# Patient Record
Sex: Male | Born: 1945 | Race: White | Hispanic: No | Marital: Married | Smoking: Never smoker
Health system: Southern US, Community
[De-identification: ages and names within clinical notes are randomized; demographics above are authoritative.]

## PROBLEM LIST (undated history)

## (undated) DIAGNOSIS — I5189 Other ill-defined heart diseases: Secondary | ICD-10-CM

## (undated) DIAGNOSIS — K589 Irritable bowel syndrome without diarrhea: Secondary | ICD-10-CM

## (undated) DIAGNOSIS — I214 Non-ST elevation (NSTEMI) myocardial infarction: Secondary | ICD-10-CM

## (undated) DIAGNOSIS — I2510 Atherosclerotic heart disease of native coronary artery without angina pectoris: Secondary | ICD-10-CM

## (undated) DIAGNOSIS — E785 Hyperlipidemia, unspecified: Secondary | ICD-10-CM

## (undated) DIAGNOSIS — I10 Essential (primary) hypertension: Secondary | ICD-10-CM

## (undated) DIAGNOSIS — N200 Calculus of kidney: Secondary | ICD-10-CM

## (undated) DIAGNOSIS — K469 Unspecified abdominal hernia without obstruction or gangrene: Secondary | ICD-10-CM

## (undated) DIAGNOSIS — K219 Gastro-esophageal reflux disease without esophagitis: Secondary | ICD-10-CM

## (undated) DIAGNOSIS — E119 Type 2 diabetes mellitus without complications: Secondary | ICD-10-CM

## (undated) DIAGNOSIS — K5790 Diverticulosis of intestine, part unspecified, without perforation or abscess without bleeding: Secondary | ICD-10-CM

## (undated) HISTORY — DX: Type 2 diabetes mellitus without complications: E11.9

## (undated) HISTORY — PX: LITHOTRIPSY: SUR834

## (undated) HISTORY — DX: Calculus of kidney: N20.0

## (undated) HISTORY — DX: Hyperlipidemia, unspecified: E78.5

## (undated) HISTORY — DX: Irritable bowel syndrome, unspecified: K58.9

## (undated) HISTORY — DX: Diverticulosis of intestine, part unspecified, without perforation or abscess without bleeding: K57.90

## (undated) HISTORY — PX: CORONARY STENT PLACEMENT: SHX1402

## (undated) HISTORY — DX: Non-ST elevation (NSTEMI) myocardial infarction: I21.4

---

## 2001-10-22 DIAGNOSIS — I2510 Atherosclerotic heart disease of native coronary artery without angina pectoris: Secondary | ICD-10-CM

## 2001-10-22 HISTORY — DX: Atherosclerotic heart disease of native coronary artery without angina pectoris: I25.10

## 2002-09-08 ENCOUNTER — Inpatient Hospital Stay (HOSPITAL_COMMUNITY): Admission: EM | Admit: 2002-09-08 | Discharge: 2002-09-11 | Payer: Self-pay | Admitting: Emergency Medicine

## 2006-04-24 ENCOUNTER — Inpatient Hospital Stay (HOSPITAL_COMMUNITY): Admission: EM | Admit: 2006-04-24 | Discharge: 2006-04-26 | Payer: Self-pay | Admitting: Cardiology

## 2006-04-24 ENCOUNTER — Ambulatory Visit: Payer: Self-pay | Admitting: Cardiology

## 2006-05-20 ENCOUNTER — Ambulatory Visit: Payer: Self-pay | Admitting: Cardiology

## 2007-02-14 ENCOUNTER — Ambulatory Visit (HOSPITAL_COMMUNITY): Admission: RE | Admit: 2007-02-14 | Discharge: 2007-02-14 | Payer: Self-pay | Admitting: General Surgery

## 2007-12-05 ENCOUNTER — Ambulatory Visit (HOSPITAL_COMMUNITY): Admission: RE | Admit: 2007-12-05 | Discharge: 2007-12-05 | Payer: Self-pay | Admitting: Family Medicine

## 2009-10-22 HISTORY — PX: COLONOSCOPY: SHX174

## 2011-03-09 NOTE — Cardiovascular Report (Signed)
[name], [name] NO.:  [id number]   MEDICAL RECORD NO.:  [id number]          PATIENT TYPE:  INP   LOCATION:  4736                         FACILITY:  [facility]   PHYSICIAN:  Arturo Morton. Riley Kill, M.D. [facility] OF BIRTH:  1946/02/28   DATE OF PROCEDURE:  04/25/2006  DATE OF DISCHARGE:                              CARDIAC CATHETERIZATION   INDICATIONS:  The patient is a 65 year old gentleman with multiple medical  problems.  He has diabetes mellitus.  He underwent stenting of both the  distal circumflex and posterior descending artery by me in 2003.  He has had  some recurrent chest pain, was evaluated by Dr. Andee Lineman, and sent in for  cardiac catheterization.  There were no definite electrocardiographic  abnormalities.  Initial set of enzymes at [facility] was negative.   PROCEDURES:  1.  Left heart catheterization.  2.  Selective coronary arteriography.  3.  Selective left ventriculography.   DESCRIPTION OF PROCEDURE:  The patient was brought to the catheterization  laboratory and prepped and draped in the usual fashion.  Through an anterior  puncture, the right femoral artery was easily entered.  A 6-French sheath  was placed.  Views of the left and right coronary arteries were obtained in  multiple angiographic projections.  Central aortic and left ventricular  pressures were measured with a pigtail.  Ventriculography was performed in  the RAO projection.  Because of elevated blood pressures, 2 doses of  intravenous metoprolol were administered.  He tolerated the procedure  without complication.  We reviewed the films with the patient in the  laboratory, and I subsequently spoke with his girlfriend afterwards.  He was  taken to the holding area in satisfactory clinical condition for direct  sheath removal.   HEMODYNAMIC DATA.:  1.  Central aortic pressure 156/85, mean 115, after one dose of intravenous      metoprolol.  2.  Left atrial pressure 155/12.  3.  No  gradient on pullback across the aortic valve.   ANGIOGRAPHIC DATA.:  1.  Ventriculography was done in the RAO projection.  Overall estimated      ejection fraction was in excess of 55% without segmental wall motion      abnormalities, and there did not appear to be significant mitral      regurgitation.  2.  On plain fluoroscopy there is evidence of fairly heavy calcification in      the left anterior descending artery.  3.  The right coronary artery is a previously-stented vessel in the PDA      distribution.  The proximal, mid and distal right are widely patent.      They are fairly smooth.  The posterior descending artery is also patent,      and the previous stent site is about 30 to no more than 40% narrowing      with a minimum lumen diameter well in excess of 2.  There is a small      posterolateral system that is free of critical disease.  4.  The left main is free of critical disease.  5.  The circumflex coronary artery provides essentially three marginal      branches.  The circumflex is previously stented after the origin of the      first two marginal branches.  There is some mild proximal calcification      but no significant focal narrowing.  At the previous site of stenting in      the distal vessel, there is absolutely no evidence of significant      restenosis and the vessel fills the distal vessel quite fully.  6.  The left anterior descending artery courses to the apex.  There are 3      diagonal branches.  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      This was previously described as 50-60% and does not appear to be      substantially changed, and we would grade this as 50% and possibly      relate it to intramyocardial compression.   CONCLUSIONS:  1.  Preserved overall left ventricular function.  2.   Widely patent stent to the distal circumflex.  3.  Patent stent to the posterior descending artery.  4.  Segmental narrowing of the distal left anterior descending artery      unchanged from the previous study.   I have spoken with Dr. Andee Lineman.  Continued medical therapy will be  recommended.  We will get a D-dimer.      Arturo Morton. Riley Kill, M.D. [facility]  Electronically Signed     TDS/MEDQ  D:  04/25/2006  T:  04/25/2006  Job:  [id number]   cc:   Learta Codding, M.D. [facility]  [address]  [address]  [place]  [place] [address]   Patrica Duel, M.D.  Fax: [phone]   Cardiovascular Laboratory

## 2011-03-09 NOTE — Cardiovascular Report (Signed)
NAME:  Dustin [name], Dustin [name]                       ACCOUNT NO.:  [id number]   MEDICAL RECORD NO.:  [id number]                   PATIENT TYPE:  INP   LOCATION:  6531                                 FACILITY:  [facility]   PHYSICIAN:  Arturo Morton. Riley Kill, M.D. [facility]         DATE OF BIRTH:  Feb 11, 1946   DATE OF PROCEDURE:  09/09/2002  DATE OF DISCHARGE:                              CARDIAC CATHETERIZATION   INDICATIONS FOR PROCEDURE:  The patient is a 65 year old gentleman who  presented with a non-ST elevation myocardial infarction.  EKG is  nondiagnostic.  He underwent cardiac catheterization by Dr. Lewayne Bunting and  this demonstrated segmental plaquing of the mid LAD, a very high-grade  stenosis in the distal circumflex, and a high-grade stenosis in the mid  posterior descending branch.  We discussed the case with the patient and  subsequently recommended percutaneous intervention.  The patient had been  previously enrolled in the ACUITY trial and was on bivalirudin.   PROCEDURE:  1. Percutaneous stenting of the distal circumflex artery.  2. Percutaneous stenting of the posterior descending artery/   DESCRIPTION OF PROCEDURE:  Diagnostic catheterization had been performed  with a #6 French sheath.  Additional bivalirudin was given and the #6 [place]  sheath exchanged for a #7 [place] sheath.  The #7 French sheath was then used  as the access conduit.  We initially chose a JL 3.5 guiding catheter to  engage the left main artery.  The lesion was crossed with a 0.014 Hi-Torque  Floppy wire and then predilated with a 2-mm balloon.  Stenting was  subsequently performed with a 2.75 x 18-mm Guidant Zeta stent.  This was  deployed without complication and then the stent was post dilated using a  noncompliant 3.25 Quantum Maverick balloon.  There was marked improvement in  the appearance of the artery.  From TIMI-1 flow, TIMI-3 was established and  the stenosis was reduced from 99% down to 0%.  We elected  not to place a  drug-eluting stent, in part, because of the very small area of distal  runoff, with this consisting of predominantly two small posterolateral  branches.   Attention was then turned to the PDA.  A JL 4 guiding catheter was utilized  with sideholes.  A 0.014 Hi-Torque Floppy wire was taken down across the  lesion and the lesion was predilated with a 2.0- and then subsequently, a  2.5-mm, balloon.  There was a small area of intraluminal dissection at the  site of the lesion.  There was also a fair amount of distal spasm which made  sizing of the stent somewhat more difficult.  We elected to place a 2.5 x 23-  mm Pixel stent and this resulted in establishment of a good lumen.  We then  post dilated the stent with a 2.75-mm Quantum Maverick balloon up to about  15 atmospheres, taking extra care to maintain patency within the stent.  At  the completion of the procedure, there was a uniform-appearing lumen.  The  stenosis was reduced from 99% down to 0%.   Plavix, 300 mg, was given at the beginning of the procedure.  At the end of  the procedure, the femoral sheath was sewn into place and the patient was  taken to the holding area in satisfactory clinical condition.   ANGIOGRAPHIC DATA:  1. The distal circumflex demonstrated a 99% stenosis with TIMI-1 flow and     this was subsequently turned into TIMI-3 flow with 0% residual stenosis.  2. The posterior descending artery had 99% narrowing with TIMI-3 flow and 0%     narrowing with TIMI-3 flow at the completion of the procedure.  The     distal reference lumen diameter was about 2.75 mm.    CONCLUSION:  Successful percutaneous stenting of the distal circumflex as  well as the posterior descending branch of the right coronary artery.   DISPOSITION:  The patient will be treated with Plavix.  He will need  aggressive medical therapy.                                                Arturo Morton. Riley Kill, M.D. [facility]    TDS/MEDQ   D:  09/09/2002  T:  09/09/2002  Job:  [id number]   cc:   Cardiac Catheterization Lab   Patrica Duel, M.D.  [address]  [place]  [place] [address]  Fax: [phone]   Rollene Rotunda, M.D. [facility]  [address]  [place]  [place] [address]  Fax: [phone]

## 2011-03-09 NOTE — Discharge Summary (Signed)
NAMEOSKAR, CRETELLA NO.:  [id number]   MEDICAL RECORD NO.:  [id number]          PATIENT TYPE:  INP   LOCATION:  4736                         FACILITY:  [facility]   PHYSICIAN:  Salvadore Farber, M.D. [facility] OF BIRTH:  1946-05-08   DATE OF ADMISSION:  04/24/2006  DATE OF DISCHARGE:  04/26/2006                                 DISCHARGE SUMMARY   REASON FOR ADMISSION:  Chest pain, concerning for unstable angina pectoris.   DISCHARGE DIAGNOSES:  1.  Noncardiac chest pain.      1.  Patent circumflex and posterior descending artery stent by          catheterization this admission.  2.  Coronary artery disease.      1.  Status post non-ST elevation myocardial infarction, November 2003,          treated with bare-metal stenting to the distal circumflex and          posterior descending artery.  3.  Preserved left ventricular function.  4.  Acute gouty arthritis, left ankle.  5.  Hypertension.  6.  Mixed dyslipidemia.  7.  Diabetes mellitus type 2.  8.  History of irritable bowel syndrome.  9.  History of obstructive sleep apnea.   PROCEDURE PERFORMED:  As mentioned, cardiac catheterization by Dr. Shawnie Pons on July 5th, 2007.  Please see his dictated note for complete  details.  The films revealed preserved overall left ventricular function,  widely patent stent of the distal circumflex, patent stent to the posterior  descending artery and segmental narrowing of the distal LAD, unchanged from  previous study.   BRIEF HISTORY:  Mr. Rorie is a 65 year old male with multiple medical  problems who presented to [facility] with chest pain that was  concerning for unstable angina pectoris.  He was transferred for further  evaluation and treatment.  He was admitted by Dr. Andee Lineman.   HOSPITAL COURSE:  As noted above, the patient was admitted for further  evaluation of his chest discomfort.  It was noted on admission that he had  an acute gouty flair of his  left ankle.  He had recently been seen urgent  care and placed on Relafen for this.  He had also had a recent right great  toe ingrown toenail removed and is on Zithromax.  He was ruled out for  myocardial infarction by enzymes.  He went for cardiac catheterization on  April 25, 2006 with Dr. Riley Kill.  The results are noted above.  Continue with  medical therapy as recommended.  He tolerated the procedure well and did not  have any complications.  The D-dimer was checked and this was negative 0.22.  On the morning of April 26, 2006, the patient was in stable condition without  any pleuritic chest pain or shortness of breath.  He will be placed on a  trial of proton pump inhibitor and will need to follow up with his primary  care physician.  His right groin is without hematomas or bruits.  He will  remain on his Relafen for his acute gouty  attack as this seems to be  improving.  He will stay off his allopurinol and follow up with his primary  care physician in the next 1-2 weeks.  He can follow up with Dr. Andee Lineman in  [place] in the next couple of weeks in cardiology followup.   LABS AND ANCILLARY DATA:  White count 8000, hemoglobin 13.7, hematocrit  40.2, platelet count 109,000.  INR 0.9, D-dimer 0.22, sodium 139, potassium  4.2, glucose 134, BUN 21, creatinine 1.2, calcium 9.2.  LFTs okay.  Total  protein 7.2, albumin 4.3, cardiac markers negative x3.  Total cholesterol  147, triglycerides 311, HDL 30, LDL 55.   Chest x-ray no acute abnormalities.   DISCHARGE MEDICATIONS:  1.  Fish oil as taken previously.  2.  Aspirin 325 mg daily.  3.  Lipitor 40 mg nightly.  4.  Niaspan as taken previously.  5.  Avandia 4 mg daily.  6.  Allopurinol 300 mg daily.  This is to be held until his gout attack      clears.  7.  Lisinopril/HCT 20/12.5 mg daily.  8.  Nitroglycerin p.r.n. chest pain.  9.  Zithromax until finished.  His last day is April 27, 2006.  10. Nabumetone 750 mg 3 times a day.  11.  Protonix 40 mg daily.   DIET:  Low fat, low sodium, diabetic diet.   ACTIVITY:  He is to remain from driving for the next 3 days.  No heavy  lifting for 3 days.  No sexual activity for 3 days.  He may shower.  He is  to increase his activities slowly.   WOUND CARE:  He is to call our office for any groin swelling, bleeding or  bruising or fever.   FOLLOWUP:  With Dr. Andee Lineman in [place] on July 30 at 2:30 p.m.  He should follow  up with Dr. Nobie Putnam in the next 1-2 weeks and he should call for an  appointment.   Total physician and PA time is greater than 30 minutes on this discharge.      Tereso Newcomer, P.A.      Salvadore Farber, M.D. [facility]  Electronically Signed    SW/MEDQ  D:  04/26/2006  T:  04/26/2006  Job:  [id number]   cc:   Patrica Duel, M.D.  Fax: [phone]

## 2011-03-09 NOTE — Assessment & Plan Note (Signed)
[facility]                            [facility] NOTE   NAME:[name], Dustin Elbert Ewings                      MRN:          [id number]  DATE:05/20/2006                            DOB:          07-06-1946    PRIMARY CARE PHYSICIAN:  Patrica Duel, MD   HISTORY OF PRESENT ILLNESS:  Dustin [name] is a 65 year old, white male who  was recently hospitalized at [facility] from April 24, 2006, to April 26, 2006, with chest discomfort after being transferred from [facility].  During that admission, he ruled out for myocardial infarction and  cardiac catheterization was performed on July 5, by Dr. Riley Kill.  The films  showed a preserved LV function with widely patent stent of the distal  circumflex and to the PDA.  He had a segmental narrowing of the distal LAD  which was unchanged from prior studies.  It was felt that he should continue  medical treatment.  He also ruled out for pulmonary embolism with a negative  D-dimer.  The etiology of his chest discomfort was not clear, however, he  was discharged home on a trial of Protonix.   Since his discharge, Dustin [name] states that he has not had any further  chest discomfort.  He was questioning the actual diagnosis as to why he had  chest discomfort.  I explained to him that we could not be sure of what  caused his chest discomfort, but we could tell him that he did not have a  heart attack, pulmonary embolism and that his stents were patent.  He has  not had any further problems with chest discomfort nor has he had any  problems with catheterization site.  He has followed up with Dr. Nobie Putnam  particularly in regards to his gout.   He states that he does try to follow a diet, he does exercise regularly and  he does not smoke.  He has not had any difficulties with his medications.   ALLERGIES:  MORPHINE caused nausea.   HOME MEDICATIONS:  1.  Allopurinol 300 mg daily.  2.   Lisinopril/hydrochlorothiazide 20/12.5 daily.  3.  Aspirin 325 daily.  4.  Fish oil 1000 mg t.i.d.  5.  Lipitor 40 nightly.  6.  Niaspan 500 daily.  7.  Protonix 40 mg daily.  8.  Colchicine 0.6 p.r.n.  9.  Nitroglycerin 0.4 p.r.n.   PAST MEDICAL HISTORY:  1.  Non-ST-elevated myocardial infarction in November 2003, treated with      bare metal stenting to the distal circumflex and PDA with a preserved LV      function.  2.  Gout.  3.  Hypertension.  4.  Dyslipidemia.  5.  Type 2 diabetes.  6.  Irritable bowel syndrome.  7.  History of obstructive sleep apnea for which he has never had a formal      evaluation.   PHYSICAL EXAMINATION:  GENERAL:  Well-developed, well-nourished, pleasant,  white male in no apparent distress.  VITAL SIGNS:  Blood pressure 110/80, pulse 72 and regular, weight 194.0.  HEENT:  Unremarkable.  NECK:  Supple without thyromegaly, adenopathy, JVD or carotid bruits.  CHEST:  Symmetrical excursion.  LUNGS:  Lung sounds are slightly diminished, but clear to auscultation.  HEART:  PMI is not displaced.  Regular rate and rhythm.  Normal S1, S2.  Did  not appreciate any rubs, clicks, gallops or murmurs.  ABDOMEN:  Obese, bowel sounds present without organomegaly, masses or  tenderness.  I did not appreciate any abdominal bruits or femoral bruits.  EXTREMITIES:  Negative clubbing, cyanosis or edema.  All peripheral pulses  were symmetrical and intact.  MUSCULOSKELETAL:  Unremarkable.   IMPRESSION:  Chest discomfort of uncertain etiology.  Cardiac  catheterization on July 5, shows patent stents.  History as listed above.   DISPOSITION:  Reassured Dustin [name] in regards to his cardiac  catheterization results.  I have encouraged him to continue cardiac risk  factor modification such as increased exercise, weight loss, improved diet  as his sugars at home remain between 130s-180s.  I have asked him to  continue to follow up with Dr. Nobie Putnam with any medical  complaints and that  we will see him back in 1 year or sooner if he should develop any questions  or cardiac issues.  He is agreeable to this plan.                                   Joellyn Rued, PA-C                                Learta Codding, MD, [facility]   EW/MedQ  DD:  05/20/2006  DT:  05/21/2006  Job #:  [id number]
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
## 2011-03-09 NOTE — H&P (Signed)
NAME:  Dustin [name], Dustin [name] NO.:  [id number]   MEDICAL RECORD NO.:  [id number]          PATIENT TYPE:  EMS   LOCATION:  ED                            FACILITY:  [facility]   PHYSICIAN:  Learta Codding, M.D. LHCDATE OF BIRTH:  07-May-1946   DATE OF ADMISSION:  04/24/2006  DATE OF DISCHARGE:                                HISTORY & PHYSICAL   REASON FOR ADMISSION:  Recurrent substernal chest pain in a 65 year old male  with known coronary artery disease.   HISTORY OF PRESENT ILLNESS:  The patient is a 64 year old white male with  the history of diabetes mellitus and known coronary artery disease.  The  patient was admitted in 2003 with substernal chest pain.  The patient ruled  in for a non-Q-wave myocardial infarction and underwent catheterization and  was found to have a thrombotic lesion of the circumflex, as well as a high-  grade stenosis of the right coronary artery.  He underwent stenting to both  arteries with bare metal stents on 09/08/2002.  He remained on antiplatelet  therapy with Plavix until June 2004.  During that admission, the patient was  diagnosed with new-onset diabetes mellitus.  The patient, after this event,  had seen [place] on several occasions in the [place] office and was doing quite  well.  He has been compliant with his medical regimen, including  lipid-  lowering drugs and his diabetes medications.  The patient, thereafter, was  followed by Dr. Nobie Putnam.  The patient has now been referred from [facility], where he was admitted with recurrent substernal chest pain.  The  patient, earlier today, was working in his garden, watering his tomatoes and  suddenly felt a heavy substernal chest pressure, although not associated  with diaphoresis or shortness of breath.  He stated that his pain was  reminiscent of his prior presentation in 2003.  The episode lasted  approximately an hour.  By the time he got to the emergency room, his pain  was essentially  resolved.  The patient did not have any associated symptoms.  He did not take nitroglycerin, as he ran out of p.r.n. nitroglycerin.   On admission to [facility], the patient's initial troponin was  within normal limits and his initial EKG showed no acute changes.   PAST MEDICAL HISTORY:  See problem list.  It includes coronary artery  disease, status post non-STIMI , November 2003.  Catheterization, November  2003, with ejection fraction 60%, LAD proximal 40, distal 60, circumflex  distal 99 with thrombotic lesion.  RCA with PD of 90%, PCI 1103 with BMS  distal circumflex and PDA.  Hypotension, hypercholesterolemia,  triglyceridemia, diabetes mellitus.   SOCIAL HISTORY:  The patient lives with a girlfriend.  He lives with a  girlfriend.  He never smoked.  He is divorced.  He has four children. He he  works for __________.   ALLERGIES:  None.   FAMILY HISTORY:  Family history significant for sister with coronary stent  at age 63. __________ abnormality 64.   MEDICATIONS:  __________  Aspirin 325 daily,  Niaspan, Avandia 4 mg daily,  Allopurinol 300 daily, lisinopril hydrochlorothiazide 20/12.5 mg p.o. daily.   REVIEW OF SYSTEMS:  As per History of Present Illness, no nausea or  vomiting, no fever or chills, no orthopnea or PND, no palpitations or  syncope.  No melena or hematochezia.  The remainder of the review of systems  is essentially negative.   PHYSICAL EXAMINATION:  VITAL SIGNS:  Blood pressure 160/91, heart rate 82  beats per minute, respirations are 22.  GENERAL:  Well-nourished white male in no apparent distress.  HEENT:  Pupils are equal, conjunctivae clear.  NECK:  The neck is supple, normal carotid upstroke, no carotid bruits.  LUNGS:  Clear.  HEART:  Regular rate and rhythm, normal sinus rhythm, no murmurs, rubs or  gallops.  ABDOMEN:  Soft.  EXTREMITY EXAM:  No cyanosis, clubbing or edema.  Femoral pulses are intact  bilaterally with no bruits.  Dorsalis  pedis and posterior tibial pulses are  intact.  The patient does have an acute gouty flare of the left ankle.   LABORATORY DATA:  Sodium 137, potassium 3.7, chloride 103, glucose 118, BUN  19, creatinine 1.2.  Liver function tests are within normal limits.  White  count is 8.9, hemoglobin is 13.8, hematocrit is 40.8, platelet count is 207.   Chest x-ray pending.   PROBLEMS:  1.  Acute coronary syndrome.      1.  Known coronary artery disease with bare metal stent to the RCA and          circumflex coronary artery.  2.  Initial negative EKG and negative troponins.  3.  Adult-onset diabetes mellitus.  4.  Dyslipidemia.  5.  Hypertension.  6.  Gouty flare.  7.  History of irritable bowel syndrome.  8.  History of obstructive sleep apnea.   PLAN:  1.  The patient's symptoms are very concerning for unstable angina.  The      patient will be placed on aspirin and Plavix will also be initiated.  We      will load the patient with 300 mg today and then can start on 75 mg a      day.  One dose of Lovenox will be given tonight, then held tomorrow and      then schedule his catheterization.  2.  Discussed with the patient risks and benefits of catheterization.  He is      willing to proceed.  3.  We will continue the patient's outpatient medications, short of the      nonsteroidal drug therapy.  We will place      him on Colchicine for his gouty flare.  We will also give the patient      some hydration overnight, particularly in light of the fact that the      patient is a diabetic and possibly may have been somewhat dehydrated      today.  4.  The patient has been scheduled for a catheterization tomorrow.      Learta Codding, M.D. [facility]  Electronically Signed     GED/MEDQ  D:  04/24/2006  T:  04/24/2006  Job:  [id number]   cc:   Patrica Duel, M.D.  Fax: [phone]   Rollene Rotunda, M.D.  [address]  [address]  [place] [address]   Learta Codding, M.D. [facility]  [address]   [address]  [place]  [place] [address]   [facility]

## 2011-03-09 NOTE — H&P (Signed)
NAME:  Dustin [name], Dustin [name]                       ACCOUNT NO.:  [id number]   MEDICAL RECORD NO.:  [id number]                   PATIENT TYPE:  INP   LOCATION:  1827                                 FACILITY:  [facility]   PHYSICIAN:  Rollene Rotunda, M.D. [facility]            DATE OF BIRTH:  04/27/1946   DATE OF ADMISSION:  09/08/2002  DATE OF DISCHARGE:                                HISTORY & PHYSICAL   REASON FOR ADMISSION:  The patient with chest pain.   HISTORY OF PRESENT ILLNESS:  The patient is a very pleasant 65 year old  white gentleman with no prior cardiac history. He said that he was hunting  today. He shot a buck and got down out of the tree to begin to track him. As  he began to walk up a hill he noticed upper epigastric, lower chest  discomfort. He did  feel a bloated and tight feeling. He passed some gas.  This discomfort would become more severe as he walked. He stopped and the  discomfort would ease. It became quite intense, 8/10 in intensity. He had  some discomfort in both of his upper arms. He was short of breath, which was  unusual. He finally went back home  and tried to take some Rolaids. He said  the pain waxed and waned somewhat. He finally decided to  present to [facility] emergency room. By the time he arrived, however, he was  pain  free. His initial EKG demonstrated perhaps mild anterior T-wave peaking, but  no other  dynamic changes. He has since been pain free. He has not had this  type of discomfort in the past. He had no nausea or vomiting. He was  diaphoretic but thinks it was in keeping with his level of exertion and the  temperature outside.   PAST MEDICAL HISTORY:  1. Hypertension x 15 years.  2. Hypertriglyceridemia.  3. Gout.   PAST SURGICAL HISTORY:  None.   ALLERGIES:  None.   MEDICATIONS:  Ziac and a gout pill.   SOCIAL HISTORY:  The patient  has never smoked cigarettes. He is divorced.  He has four children and 12 grandchildren. He  works for [employer].   FAMILY HISTORY:  Contributory for a sister with coronary stents at age 73.  His father died suddenly of an MI at age 45.   REVIEW OF SYSTEMS:  Positive for occasional sinus trouble  and cough,  diarrhea and constipation. Otherwise negative for all other systems.   PHYSICAL EXAMINATION:  GENERAL:  The patient is in no distress.  VITAL SIGNS:  Blood pressure 132/76, heart rate 70 and regular.  HEENT:  Eyes unremarkable.  Pupils equally round and reactive to light.  Fundi not visualized. Oral mucosa unremarkable.  NECK:  No jugular venous  distention. Waveform within normal limits. Carotid  upstroke brisk and symmetric. No bruits or thyromegaly.  LYMPHATICS:  No cervical, axillary  or inguinal adenopathy.  LUNGS:  Clear to auscultation bilaterally.  BACK:  No costovertebral angle tenderness.  CHEST:  Unremarkable.  HEART:  PMI not displaced or sustained, S1, S2 within normal limits, no S3,  no S4, no murmurs.  ABDOMEN:  Flat, positive bowel sounds, normal in frequency and pitch, no  rebound or guarding, midline pulse, no masses, no hepatomegaly or  splenomegaly.  SKIN:  No rashes, no nodules.  EXTREMITIES:  2+ pulses throughout, no edema, no cyanosis, no clubbing.  NEUROLOGIC:  Oriented to  person, place and time. Cranial nerves II through  XII grossly intact. Motor grossly intact.   LABORATORY DATA:  WBCs 6, hemoglobin 14.1, platelets 180. CK  113, MB 3.7,  troponin 0.09. Sodium 134, potassium 3.7, chloride 97, glucose 200, BUN 10,  creatinine 0.9.   An EKG at [facility]:  Sinus bradycardia, rate 58, left axis  deviation, nonspecific inferior T-wave changes, mild anterior T-wave  peaking.   ASSESSMENT AND PLAN:  1. Chest discomfort. The patient's chest discomfort is suggestive of new     onset (unstable) angina. He does have a mildly elevated troponin. He has     cardiovascular risk factors. Given this I think the most  prudent     evaluation is cardiac catheterization. The patient will continue to be     ruled out  for myocardial infarction. He will be treated for heparin,     nitroglycerin, aspirin and beta blockers. I have discussed the risks and     benefits of cardiac catheterization and the patient and his family agreed     to proceed.  2. Hypertension.  The patient will be continued on a beta blocker. He will     bring his home dose of Ziac and probably could be continued on this as an     outpatient.  3. Risk reduction. The patient needs to be counseled on weight loss. We will     also check a lipid profile.  4. Elevated blood sugar. The patient is noted  to have an elevated  blood     sugar. He has no history of diabetes. We will check a hemoglobin A1C.  5. Gout. The patient will continue on  his home regimen.                                               Rollene Rotunda, M.D. [facility]    JH/MEDQ  D:  09/08/2002  T:  09/08/2002  Job:  [id number]   cc:   Patrica Duel, M.D.  [address]  [place]  [place] [address]  Fax: [phone]

## 2011-03-09 NOTE — H&P (Signed)
[name], [name] NO.:  [id number]   MEDICAL RECORD NO.:  [id number]          PATIENT TYPE:  AMB   LOCATION:  DAY                           FACILITY:  [facility]   PHYSICIAN:  Dalia Heading, M.D.  DATE OF BIRTH:  1946/10/20   DATE OF ADMISSION:  DATE OF DISCHARGE:  LH                              HISTORY & PHYSICAL   CHIEF COMPLAINT:  Hematochezia.   HISTORY OF PRESENT ILLNESS:  The patient is a 65 year old white male who  is referred for endoscopic evaluation.  He needs a colonoscopy for  hematochezia.  He recently passed some blood per rectum and has had  lower abdominal cramping.  No weight loss, nausea, vomiting, diarrhea,  constipation, or melena have been noted.  He has never had a  colonoscopy.  There is no family history of colon carcinoma.   PAST MEDICAL HISTORY:  1. Gout.  2. Non-insulin-dependent diabetes mellitus.  3. Hypertension.   PAST SURGICAL HISTORY:  Coronary stent placements   CURRENT MEDICATIONS:  Indomethacin, Actos,  Lisinopril/hydrochlorothiazide, allopurinol, aspirin which he is  holding, glucosamine, Lipitor, __________.   ALLERGIES:  MORPHINE.   REVIEW OF SYSTEMS:  Noncontributory.   PHYSICAL EXAMINATION:  GENERAL:  The patient is a well-developed, well-  nourished white male in no acute distress.  LUNGS:  Clear to auscultation with equal breath sounds bilaterally.  HEART:  Reveals a regular rate and rhythm without S3, S4, murmurs.  ABDOMEN:  Soft, nontender, nondistended.  No hepatosplenomegaly or  masses noted.  RECTAL:  Deferred to the procedure.   IMPRESSION:  Hematochezia.   PLAN:  The patient is scheduled for a colonoscopy on February 14, 2007.  The risks and benefits of the procedure including bleeding and  perforation were fully explained to the patient, gave informed consent.      Dalia Heading, M.D.  Electronically Signed     MAJ/MEDQ  D:  02/13/2007  T:  02/13/2007  Job:  [id number]   cc:   [facility]  Fax: [phone]   Patrica Duel, M.D.  Fax: [phone]

## 2011-03-09 NOTE — Cardiovascular Report (Signed)
NAME:  Dustin [name], Dustin [name]                       ACCOUNT NO.:  [id number]   MEDICAL RECORD NO.:  [id number]                   PATIENT TYPE:  INP   LOCATION:  6531                                 FACILITY:  [facility]   PHYSICIAN:  Learta Codding, M.D. [facility]             DATE OF BIRTH:  [birth year]   DATE OF PROCEDURE:  DATE OF DISCHARGE:  09/11/2002                              CARDIAC CATHETERIZATION   PROCEDURE:  1. Left heart catheterization with selective coronary angiography.  2. Ventriculography.   DIAGNOSIS:  1. Two-vessel coronary artery disease with thrombotic lesion of the     circumflex coronary artery.  2. Normal left ventricular systolic function.   DESCRIPTION OF PROCEDURE:  After informed consent was obtained, the patient  was brought to the catheterization lab.  The right groin was sterilely  prepped and draped.  A 6-French sheath was placed using the modified  Seldinger technique.  JL4 and JR4 6-French catheters were used for selective  coronary angiography.  A 6-French angled pigtail catheter was used for  ventriculography.  At the termination of the procedure, the sheath was left  in place in anticipation for a percutaneous coronary intervention of the  circumflex and right coronary artery.   FINDINGS:  1. Hemodynamics     A. Left ventricular pressure 152/13 mmHg.     B. Aortic pressure 152/98 mmHg.  2. Left ventriculography:  Ejection fraction 60%.  No segmental wall motion     abnormalities.  No mitral regurgitation.  3. Selective coronary angiography     A. The left main coronary artery was a large caliber vessel with no        evidence of flow-limiting coronary artery disease.     B. The left anterior descending artery was a large vessel, calcified in        the proximal segment, with diffuse stenosis of approximately 40%.  The        distal LAD had a diffuse 50% to 60% stenosis.     C. The circumflex coronary artery was a large caliber vessel.  The obtuse   marginal branches were free of flow-limiting disease.  The circumflex        proper and the distal segment had a long 99% stenosis followed by a        long thrombotic lesion.     D. The right coronary artery was free of flow-limiting disease.  There        was a 90% focal stenosis in the posterior descending artery.    IMPRESSION AND PLAN:  Intravascular images were reviewed with Dr. Riley Kill.  The plan is to proceed with percutaneous coronary intervention to the  circumflex and right coronary artery.  Learta Codding, M.D. [facility]    GED/MEDQ  D:  01/31/2003  T:  01/31/2003  Job:  [id number]   cc:   Patrica Duel, M.D.  [address]  [place]  [place] [address]  Fax: [phone]   Rollene Rotunda, M.D. [facility]

## 2011-11-12 ENCOUNTER — Encounter: Payer: Self-pay | Admitting: Cardiology

## 2011-11-13 ENCOUNTER — Encounter: Payer: Self-pay | Admitting: Cardiology

## 2011-11-13 ENCOUNTER — Ambulatory Visit (INDEPENDENT_AMBULATORY_CARE_PROVIDER_SITE_OTHER): Payer: Commercial Indemnity | Admitting: Cardiology

## 2011-11-13 ENCOUNTER — Ambulatory Visit: Payer: Self-pay | Admitting: Cardiology

## 2011-11-13 VITALS — BP 140/92 | HR 66 | Resp 18 | Ht 70.0 in | Wt 186.0 lb

## 2011-11-13 DIAGNOSIS — I2510 Atherosclerotic heart disease of native coronary artery without angina pectoris: Secondary | ICD-10-CM

## 2011-11-13 DIAGNOSIS — I10 Essential (primary) hypertension: Secondary | ICD-10-CM | POA: Insufficient documentation

## 2011-11-13 DIAGNOSIS — E119 Type 2 diabetes mellitus without complications: Secondary | ICD-10-CM

## 2011-11-13 DIAGNOSIS — E785 Hyperlipidemia, unspecified: Secondary | ICD-10-CM | POA: Insufficient documentation

## 2011-11-13 DIAGNOSIS — G4733 Obstructive sleep apnea (adult) (pediatric): Secondary | ICD-10-CM | POA: Insufficient documentation

## 2011-11-13 DIAGNOSIS — K5790 Diverticulosis of intestine, part unspecified, without perforation or abscess without bleeding: Secondary | ICD-10-CM | POA: Insufficient documentation

## 2011-11-13 DIAGNOSIS — N200 Calculus of kidney: Secondary | ICD-10-CM | POA: Insufficient documentation

## 2011-11-13 MED ORDER — ATORVASTATIN CALCIUM 40 MG PO TABS: 40.0000 mg | ORAL_TABLET | Freq: Every day | ORAL | Status: DC

## 2011-11-13 NOTE — Assessment & Plan Note (Signed)
Lipid profile was excellent when last assessed a few months ago.

## 2011-11-13 NOTE — Patient Instructions (Signed)
Your physician has recommended you make the following change in your medication: decrease Lipitor to 40 mg at bedtime  Your physician recommends that you schedule a follow-up appointment in: 1 year  Your physician recommends that you increase your activity and loss weight

## 2011-11-13 NOTE — Assessment & Plan Note (Signed)
Blood pressure control is adequate-continue current therapy.

## 2011-11-13 NOTE — Progress Notes (Signed)
Patient ID: Dustin [name], male   DOB: April 19, 1946, 66 y.o.   MRN: [id number]  HPI: Reevaluation requested by [facility] for continuing care of coronary artery disease and cardiovascular risk factors.  This nice gentleman presented with ischemic chest pain 10 years ago and required placement of a PDA stent.  He did well until 2007 when he was readmitted with chest discomfort.  Repeat coronary angiography revealed insignificant distal LAD disease and a patent stent.  He has had no cardiopulmonary symptoms since, but was recently found to have a suboptimal lipid profile and was referred back to [place] for continuing cardiology care.  As a result of intermittent leg discomfort, patient has reduced his dose of atorvastatin to 27 mg per day (1/3 of an 80 mg tablet).  He reports intermittent acute onset of severe pain lasting a matter of minutes, which sounds more like a muscle cramp than statin-induced myalgias.  Symptoms have improved on a lower dose of atorvastatin.   Current Outpatient Prescriptions  Medication Sig Dispense Refill  . amLODipine (NORVASC) 5 MG tablet Take 5 mg by mouth daily.      Marland Kitchen aspirin 81 MG tablet Take 160 mg by mouth daily.      Marland Kitchen atorvastatin (LIPITOR) 40 MG tablet Take 1 tablet (40 mg total) by mouth daily.  30 tablet  11  . colchicine 0.6 MG tablet Take 0.6 mg by mouth as directed.      . fish oil-omega-3 fatty acids 1000 MG capsule Take by mouth as directed.      Marland Kitchen HYDROcodone-acetaminophen (VICODIN) 5-500 MG per tablet Take 1 tablet by mouth every 6 (six) hours as needed.      Marland Kitchen lisinopril-hydrochlorothiazide (PRINZIDE,ZESTORETIC) 20-12.5 MG per tablet Take 1 tablet by mouth daily.      . sitaGLIPtan-metformin (JANUMET) 50-500 MG per tablet Take 1 tablet by mouth 2 (two) times daily with a meal.       Allergies  Allergen Reactions  . Morphine And Related   . Ziac (Bisoprolol-Hydrochlorothiazide)       Past Medical History  Diagnosis Date  . Arteriosclerotic  cardiovascular disease (ASCVD) 2003    Non-ST segment elevation MI->  BMS; coronary angiography in 2007-patent stent; nonobstructive LAD  . Hyperlipidemia     Lipid profile: 08/2011-151, 379, 28, 47  . Diabetes mellitus, type 2     FBS of 104 in 08/2011  . Hypertension     Lab: 08/2011-normal CMet & CBC  . Irritable bowel syndrome   . Obstructive sleep apnea   . Nephrolithiasis   . Diverticulitis      History reviewed. No pertinent past surgical history.   Family History  Problem Relation Age of Onset  . Heart attack Sister   . Heart attack Father   . Heart attack Mother   . Hyperlipidemia Mother   . Hyperlipidemia Father   . Hyperlipidemia Sister   . Diabetes Mother      History   Social History  . Marital Status: Single    Spouse Name: N/A    Number of Children: N/A  . Years of Education: N/A   Occupational History  . Not on file.   Social History Main Topics  . Smoking status: Never Smoker   . Smokeless tobacco: Never Used  . Alcohol Use: No  . Drug Use: No  . Sexually Active: Not on file   Other Topics Concern  . Not on file   Social History Narrative  . No narrative  on file    ROS:  Requires corrective lenses; does not receive regular dental care; appetite stable but has lost 20 pounds in recent months; chronic low back pain.    All other systems reviewed and are negative.  PHYSICAL EXAM: BP 140/92  Pulse 66  Resp 18  Ht 5\' 10"  (1.778 m)  Wt 84.369 kg (186 lb)  BMI 26.69 kg/m2; Repeat BP-125/80  General-Well-developed; no acute distress Body Habitus-proportionate weight and height HEENT-[place]/AT; PERRL; EOM intact; conjunctiva and lids nl Neck-No JVD; no carotid bruits Endocrine-No thyromegaly Lungs-Clear lung fields; resonant percussion; normal I-to-E ratio Cardiovascular- normal PMI; normal S1 and S2 Abdomen-BS normal; soft and non-tender without masses or organomegaly Musculoskeletal-No deformities, cyanosis or clubbing Neurologic-Nl cranial  nerves; symmetric strength and tone Skin- Warm, no significant lesions Extremities-Nl distal pulses; no edema  EKG:  Normal sinus rhythm, sinus arrhythmia, left anterior fascicular block, abnormal R wave progression.   No previous tracing for comparison  ASSESSMENT AND PLAN:  [name], MD 11/13/2011 4:08 PM

## 2011-11-13 NOTE — Assessment & Plan Note (Signed)
Serum glucose appears to be well controlled on fairly low doses sitagliptin-metformin.

## 2011-11-13 NOTE — Assessment & Plan Note (Addendum)
Patient has done exceedingly well since intervention for single vessel disease 10 years ago.  Currently has no symptoms to suggest myocardial ischemia, and the strategy of optimally managing cardiovascular risk factors will be pursued.  Although he hunts in season, aerobic activity is relatively limited.  He is encouraged to exercise regularly and lose weight.

## 2011-11-15 ENCOUNTER — Telehealth (HOSPITAL_COMMUNITY): Payer: Self-pay | Admitting: Dietician

## 2011-11-15 NOTE — Telephone Encounter (Signed)
Received referral from [facility] on 11/14/11 for dx: high triglycerides.

## 2011-11-15 NOTE — Telephone Encounter (Signed)
Sent letter to pt home via [place] Mail.

## 2011-11-22 NOTE — Telephone Encounter (Signed)
Sent letter to pt home via [place] Mail in attempt to contact pt to schedule appointment.  

## 2011-11-29 NOTE — Telephone Encounter (Signed)
Sent letter to pt home via [place] Mail in attempt to contact pt to schedule appointment.  

## 2011-12-07 NOTE — Telephone Encounter (Signed)
Sent letter to pt home via [place] Mail in attempt to contact pt to schedule appointment.  

## 2011-12-11 NOTE — Telephone Encounter (Signed)
Pt has not responded to multiple contact attempts. Referral filed.  

## 2012-11-22 ENCOUNTER — Other Ambulatory Visit: Payer: Self-pay | Admitting: Cardiology

## 2012-12-15 ENCOUNTER — Other Ambulatory Visit: Payer: Self-pay | Admitting: Cardiology

## 2012-12-15 MED ORDER — ATORVASTATIN CALCIUM 40 MG PO TABS: 40.0000 mg | ORAL_TABLET | Freq: Every day | ORAL | Status: DC

## 2014-01-12 ENCOUNTER — Other Ambulatory Visit: Payer: Self-pay | Admitting: Cardiology

## 2014-06-10 ENCOUNTER — Emergency Department (HOSPITAL_COMMUNITY): Payer: Commercial Indemnity

## 2014-06-10 ENCOUNTER — Encounter (HOSPITAL_COMMUNITY): Payer: Self-pay | Admitting: Emergency Medicine

## 2014-06-10 ENCOUNTER — Observation Stay (HOSPITAL_COMMUNITY): Payer: Commercial Indemnity

## 2014-06-10 ENCOUNTER — Observation Stay (HOSPITAL_COMMUNITY)
Admission: EM | Admit: 2014-06-10 | Discharge: 2014-06-11 | Disposition: A | Discharge status: Home / Self Care | Payer: Commercial Indemnity | Attending: Internal Medicine | Admitting: Internal Medicine

## 2014-06-10 DIAGNOSIS — I5189 Other ill-defined heart diseases: Secondary | ICD-10-CM

## 2014-06-10 DIAGNOSIS — I10 Essential (primary) hypertension: Secondary | ICD-10-CM

## 2014-06-10 DIAGNOSIS — N200 Calculus of kidney: Secondary | ICD-10-CM

## 2014-06-10 DIAGNOSIS — K589 Irritable bowel syndrome without diarrhea: Secondary | ICD-10-CM | POA: Insufficient documentation

## 2014-06-10 DIAGNOSIS — Z87442 Personal history of urinary calculi: Secondary | ICD-10-CM | POA: Diagnosis not present

## 2014-06-10 DIAGNOSIS — E119 Type 2 diabetes mellitus without complications: Secondary | ICD-10-CM | POA: Diagnosis not present

## 2014-06-10 DIAGNOSIS — I2510 Atherosclerotic heart disease of native coronary artery without angina pectoris: Secondary | ICD-10-CM

## 2014-06-10 DIAGNOSIS — R1013 Epigastric pain: Secondary | ICD-10-CM | POA: Diagnosis present

## 2014-06-10 DIAGNOSIS — K219 Gastro-esophageal reflux disease without esophagitis: Secondary | ICD-10-CM | POA: Diagnosis present

## 2014-06-10 DIAGNOSIS — G4733 Obstructive sleep apnea (adult) (pediatric): Secondary | ICD-10-CM

## 2014-06-10 DIAGNOSIS — R0789 Other chest pain: Principal | ICD-10-CM | POA: Insufficient documentation

## 2014-06-10 DIAGNOSIS — Z79899 Other long term (current) drug therapy: Secondary | ICD-10-CM | POA: Diagnosis not present

## 2014-06-10 DIAGNOSIS — I498 Other specified cardiac arrhythmias: Secondary | ICD-10-CM

## 2014-06-10 DIAGNOSIS — E785 Hyperlipidemia, unspecified: Secondary | ICD-10-CM | POA: Diagnosis not present

## 2014-06-10 DIAGNOSIS — Z7982 Long term (current) use of aspirin: Secondary | ICD-10-CM | POA: Diagnosis not present

## 2014-06-10 DIAGNOSIS — R079 Chest pain, unspecified: Secondary | ICD-10-CM

## 2014-06-10 DIAGNOSIS — E7800 Pure hypercholesterolemia, unspecified: Secondary | ICD-10-CM | POA: Diagnosis not present

## 2014-06-10 DIAGNOSIS — R001 Bradycardia, unspecified: Secondary | ICD-10-CM

## 2014-06-10 DIAGNOSIS — R0683 Snoring: Secondary | ICD-10-CM

## 2014-06-10 DIAGNOSIS — I7090 Unspecified atherosclerosis: Secondary | ICD-10-CM

## 2014-06-10 DIAGNOSIS — D649 Anemia, unspecified: Secondary | ICD-10-CM

## 2014-06-10 HISTORY — DX: Essential (primary) hypertension: I10

## 2014-06-10 HISTORY — DX: Atherosclerotic heart disease of native coronary artery without angina pectoris: I25.10

## 2014-06-10 HISTORY — DX: Other ill-defined heart diseases: I51.89

## 2014-06-10 HISTORY — DX: Unspecified abdominal hernia without obstruction or gangrene: K46.9

## 2014-06-10 HISTORY — DX: Gastro-esophageal reflux disease without esophagitis: K21.9

## 2014-06-10 LAB — IRON AND TIBC
Iron: 102 ug/dL (ref 42–135)
Saturation Ratios: 27 % (ref 20–55)
TIBC: 383 ug/dL (ref 215–435)
UIBC: 281 ug/dL (ref 125–400)

## 2014-06-10 LAB — BASIC METABOLIC PANEL
ANION GAP: 15 (ref 5–15)
BUN: 20 mg/dL (ref 6–23)
CO2: 23 mEq/L (ref 19–32)
Calcium: 9.6 mg/dL (ref 8.4–10.5)
Chloride: 104 mEq/L (ref 96–112)
Creatinine, Ser: 1.1 mg/dL (ref 0.50–1.35)
GFR calc non Af Amer: 68 mL/min — ABNORMAL LOW (ref >90)
GFR, EST AFRICAN AMERICAN: 78 mL/min — AB (ref >90)
Glucose, Bld: 117 mg/dL — ABNORMAL HIGH (ref 70–99)
POTASSIUM: 4 meq/L (ref 3.7–5.3)
Sodium: 142 mEq/L (ref 137–147)

## 2014-06-10 LAB — CBC WITH DIFFERENTIAL/PLATELET
BASOS ABS: 0 10*3/uL (ref 0.0–0.1)
BASOS PCT: 0 % (ref 0–1)
EOS ABS: 0.1 10*3/uL (ref 0.0–0.7)
Eosinophils Relative: 1 % (ref 0–5)
HCT: 37.1 % — ABNORMAL LOW (ref 39.0–52.0)
HEMOGLOBIN: 12.7 g/dL — AB (ref 13.0–17.0)
Lymphocytes Relative: 32 % (ref 12–46)
Lymphs Abs: 2 10*3/uL (ref 0.7–4.0)
MCH: 31.3 pg (ref 26.0–34.0)
MCHC: 34.2 g/dL (ref 30.0–36.0)
MCV: 91.4 fL (ref 78.0–100.0)
MONOS PCT: 6 % (ref 3–12)
Monocytes Absolute: 0.4 10*3/uL (ref 0.1–1.0)
NEUTROS ABS: 3.7 10*3/uL (ref 1.7–7.7)
NEUTROS PCT: 61 % (ref 43–77)
PLATELETS: 247 10*3/uL (ref 150–400)
RBC: 4.06 MIL/uL — ABNORMAL LOW (ref 4.22–5.81)
RDW: 12.8 % (ref 11.5–15.5)
WBC: 6.1 10*3/uL (ref 4.0–10.5)

## 2014-06-10 LAB — TROPONIN I: Troponin I: <0.3 ng/mL (ref <0.30)

## 2014-06-10 LAB — GLUCOSE, CAPILLARY
Glucose-Capillary: 172 mg/dL — ABNORMAL HIGH (ref 70–99)
Glucose-Capillary: 113 mg/dL — ABNORMAL HIGH (ref 70–99)

## 2014-06-10 LAB — HEPATIC FUNCTION PANEL
ALK PHOS: 32 U/L — AB (ref 39–117)
ALT: 19 U/L (ref 0–53)
AST: 26 U/L (ref 0–37)
Albumin: 4.1 g/dL (ref 3.5–5.2)
BILIRUBIN TOTAL: 0.5 mg/dL (ref 0.3–1.2)
Bilirubin, Direct: <0.2 mg/dL (ref 0.0–0.3)
TOTAL PROTEIN: 7 g/dL (ref 6.0–8.3)

## 2014-06-10 LAB — LIPASE, BLOOD: Lipase: 35 U/L (ref 11–59)

## 2014-06-10 MED ORDER — GUAIFENESIN-DM 100-10 MG/5ML PO SYRP: 5.0000 mL | ORAL_SOLUTION | ORAL | Status: DC | PRN

## 2014-06-10 MED ORDER — HYDROCHLOROTHIAZIDE 12.5 MG PO CAPS
12.5000 mg | ORAL_CAPSULE | Freq: Every day | ORAL | Status: DC
  Administered 2014-06-10 – 2014-06-11 (×2): 12.5 mg via ORAL
  Filled 2014-06-10 (×2): qty 1

## 2014-06-10 MED ORDER — ONDANSETRON HCL 4 MG/2ML IJ SOLN: 4.0000 mg | Freq: Four times a day (QID) | INTRAMUSCULAR | Status: DC | PRN

## 2014-06-10 MED ORDER — HYDROCODONE-ACETAMINOPHEN 5-325 MG PO TABS: 1.0000 | ORAL_TABLET | ORAL | Status: DC | PRN

## 2014-06-10 MED ORDER — ACETAMINOPHEN 325 MG PO TABS: 650.0000 mg | ORAL_TABLET | Freq: Four times a day (QID) | ORAL | Status: DC | PRN

## 2014-06-10 MED ORDER — AMLODIPINE BESYLATE 5 MG PO TABS
5.0000 mg | ORAL_TABLET | Freq: Every day | ORAL | Status: DC
  Administered 2014-06-10 – 2014-06-11 (×2): 5 mg via ORAL
  Filled 2014-06-10 (×2): qty 1

## 2014-06-10 MED ORDER — INSULIN ASPART 100 UNIT/ML ~~LOC~~ SOLN: 0.0000 [IU] | Freq: Every day | SUBCUTANEOUS | Status: DC

## 2014-06-10 MED ORDER — ALUM & MAG HYDROXIDE-SIMETH 200-200-20 MG/5ML PO SUSP: 30.0000 mL | Freq: Four times a day (QID) | ORAL | Status: DC | PRN

## 2014-06-10 MED ORDER — COLCHICINE 0.6 MG PO TABS
0.6000 mg | ORAL_TABLET | Freq: Every day | ORAL | Status: DC | PRN
Start: 2014-06-10 — End: 2014-06-11

## 2014-06-10 MED ORDER — ACETAMINOPHEN 650 MG RE SUPP
650.0000 mg | Freq: Four times a day (QID) | RECTAL | Status: DC | PRN
Start: 2014-06-10 — End: 2014-06-11

## 2014-06-10 MED ORDER — INSULIN ASPART 100 UNIT/ML ~~LOC~~ SOLN
0.0000 [IU] | Freq: Three times a day (TID) | SUBCUTANEOUS | Status: DC
  Administered 2014-06-10 – 2014-06-11 (×3): 2 [IU] via SUBCUTANEOUS

## 2014-06-10 MED ORDER — ASPIRIN EC 81 MG PO TBEC
81.0000 mg | DELAYED_RELEASE_TABLET | Freq: Every day | ORAL | Status: DC
  Administered 2014-06-11: 81 mg via ORAL
  Filled 2014-06-10: qty 1

## 2014-06-10 MED ORDER — ONDANSETRON HCL 4 MG PO TABS: 4.0000 mg | ORAL_TABLET | Freq: Four times a day (QID) | ORAL | Status: DC | PRN

## 2014-06-10 MED ORDER — PANTOPRAZOLE SODIUM 40 MG PO TBEC
40.0000 mg | DELAYED_RELEASE_TABLET | Freq: Every day | ORAL | Status: DC
  Administered 2014-06-10 – 2014-06-11 (×2): 40 mg via ORAL
  Filled 2014-06-10 (×2): qty 1

## 2014-06-10 MED ORDER — LISINOPRIL-HYDROCHLOROTHIAZIDE 20-12.5 MG PO TABS: 1.0000 | ORAL_TABLET | Freq: Every day | ORAL | Status: DC

## 2014-06-10 MED ORDER — FENOFIBRATE 160 MG PO TABS
160.0000 mg | ORAL_TABLET | Freq: Every day | ORAL | Status: DC
Start: 2014-06-10 — End: 2014-06-11
  Administered 2014-06-10: 160 mg via ORAL
  Filled 2014-06-10 (×4): qty 1

## 2014-06-10 MED ORDER — POTASSIUM CHLORIDE IN NACL 20-0.9 MEQ/L-% IV SOLN
INTRAVENOUS | Status: DC
  Administered 2014-06-10 – 2014-06-11 (×2): via INTRAVENOUS

## 2014-06-10 MED ORDER — OMEGA-3-ACID ETHYL ESTERS 1 G PO CAPS
2.0000 g | ORAL_CAPSULE | Freq: Three times a day (TID) | ORAL | Status: DC
  Administered 2014-06-10 – 2014-06-11 (×3): 2 g via ORAL
  Filled 2014-06-10 (×3): qty 2

## 2014-06-10 MED ORDER — ALBUTEROL SULFATE (2.5 MG/3ML) 0.083% IN NEBU: 2.5000 mg | INHALATION_SOLUTION | RESPIRATORY_TRACT | Status: DC | PRN

## 2014-06-10 MED ORDER — ASPIRIN 81 MG PO CHEW
324.0000 mg | CHEWABLE_TABLET | Freq: Once | ORAL | Status: AC
  Administered 2014-06-10: 324 mg via ORAL
  Filled 2014-06-10: qty 4

## 2014-06-10 MED ORDER — ATORVASTATIN CALCIUM 40 MG PO TABS
40.0000 mg | ORAL_TABLET | Freq: Every evening | ORAL | Status: DC
  Administered 2014-06-10: 40 mg via ORAL
  Filled 2014-06-10: qty 1

## 2014-06-10 MED ORDER — LISINOPRIL 10 MG PO TABS
20.0000 mg | ORAL_TABLET | Freq: Every day | ORAL | Status: DC
  Administered 2014-06-10 – 2014-06-11 (×2): 20 mg via ORAL
  Filled 2014-06-10 (×2): qty 2

## 2014-06-10 MED ORDER — HEPARIN SODIUM (PORCINE) 5000 UNIT/ML IJ SOLN
5000.0000 [IU] | Freq: Three times a day (TID) | INTRAMUSCULAR | Status: DC
  Administered 2014-06-10 – 2014-06-11 (×3): 5000 [IU] via SUBCUTANEOUS
  Filled 2014-06-10 (×3): qty 1

## 2014-06-10 NOTE — ED Provider Notes (Signed)
CSN: [id number]     Arrival date & time 06/10/14  1027 History  This chart was scribed for Maudry Diego, MD by Ludger Nutting, ED Scribe. This patient was seen in room [facility]18[facility]18 and the patient's care was started 10:50 AM.    Chief Complaint  Patient presents with  . Chest Pain    Patient is a 68 y.o. male presenting with chest pain. The history is provided by the patient. No language interpreter was used.  Chest Pain Pain location:  Substernal area Pain quality: dull and sharp   Pain radiates to:  Does not radiate Pain radiates to the back: no   Pain severity:  Mild Onset quality:  Sudden Duration:  15 minutes Timing:  Constant Progression:  Resolved Chronicity:  New Associated symptoms: no abdominal pain, no back pain, no cough, no diaphoresis, no fatigue, no headache and no shortness of breath   Risk factors: coronary artery disease, diabetes mellitus, high cholesterol and hypertension     HPI Comments: Dustin [name] is a 68 y.o. male with past medical history of HLD, ASCVD, HTN, DM who presents to the Emergency Department complaining of an episode of substernal chest pain that lasted for 10-15 min which occurred approximately 40 min ago. Patient states the pain is now resolved. He describes the pain as dull and then sharp; states it feels like "gas pains". He reports a history of an MI at age 78 and states these symptoms are somewhat similar. He reports a possible cardiac catheterization in 2007. He denies diaphoresis, SOB. He does not take nitroglycerin.   Past Medical History  Diagnosis Date  . Arteriosclerotic cardiovascular disease (ASCVD) 2003    Non-ST segment elevation MI->  BMS; coronary angiography in 2007-patent stent; nonobstructive LAD  . Hyperlipidemia     Lipid profile: 08/2011-151, 379, 28, 47  . Diabetes mellitus, type 2     FBS of 104 in 08/2011  . Hypertension     Lab: 08/2011-normal CMet & CBC  . Irritable bowel syndrome   . Obstructive sleep apnea   .  Nephrolithiasis   . Diverticulosis   . Hypercholesteremia    Past Surgical History  Procedure Laterality Date  . Colonoscopy  2011    diverticulosis   Family History  Problem Relation Age of Onset  . Heart attack Sister   . Heart attack Father   . Heart attack Mother   . Hyperlipidemia Mother   . Hyperlipidemia Father   . Hyperlipidemia Sister   . Diabetes Mother    History  Substance Use Topics  . Smoking status: Never Smoker   . Smokeless tobacco: Never Used  . Alcohol Use: No    Review of Systems  Constitutional: Negative for diaphoresis, appetite change and fatigue.  HENT: Negative for congestion, ear discharge and sinus pressure.   Eyes: Negative for discharge.  Respiratory: Negative for cough and shortness of breath.   Cardiovascular: Positive for chest pain.  Gastrointestinal: Negative for abdominal pain and diarrhea.  Genitourinary: Negative for frequency and hematuria.  Musculoskeletal: Negative for back pain.  Skin: Negative for rash.  Neurological: Negative for seizures and headaches.  Psychiatric/Behavioral: Negative for hallucinations.      Allergies  Morphine and related and Ziac  Home Medications   Prior to Admission medications   Medication Sig Start Date End Date Taking? Authorizing Provider  amLODipine (NORVASC) 5 MG tablet Take 5 mg by mouth daily.   Yes Historical Provider, MD  aspirin EC 81 MG tablet Take  81 mg by mouth daily.   Yes Historical Provider, MD  atorvastatin (LIPITOR) 40 MG tablet Take 40 mg by mouth daily.   Yes Historical Provider, MD  colchicine 0.6 MG tablet Take 0.6 mg by mouth daily as needed (gout flare).    Yes Historical Provider, MD  fenofibrate (TRICOR) 145 MG tablet Take 1 tablet by mouth daily. 05/17/14  Yes Historical Provider, MD  fish oil-omega-3 fatty acids 1000 MG capsule Take 2 g by mouth 3 (three) times daily.    Yes Historical Provider, MD  glipiZIDE (GLUCOTROL) 10 MG tablet Take 1 tablet by mouth daily.  05/17/14  Yes Historical Provider, MD  HYDROcodone-acetaminophen (VICODIN) 5-500 MG per tablet Take 1 tablet by mouth every 6 (six) hours as needed for pain.    Yes Historical Provider, MD  lisinopril-hydrochlorothiazide (PRINZIDE,ZESTORETIC) 20-12.5 MG per tablet Take 1 tablet by mouth daily.   Yes Historical Provider, MD  metFORMIN (GLUCOPHAGE) 500 MG tablet Take 2 tablets by mouth 2 (two) times daily. 06/02/14  Yes Historical Provider, MD   BP 131/61  Pulse 58  Temp(Src) 98 F (36.7 C) (Oral)  Ht 5\' 10"  (1.778 m)  Wt 185 lb (83.915 kg)  BMI 26.54 kg/m2  SpO2 96% Physical Exam  Nursing note and vitals reviewed. Constitutional: He is oriented to person, place, and time. He appears well-developed.  HENT:  Head: Normocephalic.  Eyes: Conjunctivae and EOM are normal. No scleral icterus.  Neck: Neck supple. No thyromegaly present.  Cardiovascular: Normal rate, regular rhythm and normal heart sounds.  Exam reveals no gallop and no friction rub.   No murmur heard. Pulmonary/Chest: Effort normal and breath sounds normal. No stridor. He has no wheezes. He has no rales. He exhibits no tenderness.  Abdominal: He exhibits no distension. There is no tenderness. There is no rebound.  Musculoskeletal: Normal range of motion. He exhibits no edema.  Lymphadenopathy:    He has no cervical adenopathy.  Neurological: He is oriented to person, place, and time. He exhibits normal muscle tone. Coordination normal.  Skin: No rash noted. No erythema.  Psychiatric: He has a normal mood and affect. His behavior is normal.    ED Course  Procedures (including critical care time)  DIAGNOSTIC STUDIES: Oxygen Saturation is 96% on RA, adequate by my interpretation.    COORDINATION OF CARE: 10:56 AM Discussed treatment plan with pt at bedside and pt agreed to plan.   Labs Review Labs Reviewed  CBC WITH DIFFERENTIAL - Abnormal; Notable for the following:    RBC 4.06 (*)    Hemoglobin 12.7 (*)    HCT 37.1  (*)    All other components within normal limits  BASIC METABOLIC PANEL  TROPONIN I    Imaging Review No results found.   EKG Interpretation   Date/Time:  Thursday June 10 2014 10:35:59 EDT Ventricular Rate:  59 PR Interval:  178 QRS Duration: 91 QT Interval:  417 QTC Calculation: 413 R Axis:   -147 Text Interpretation:  Right and left arm electrode reversal,  interpretation assumes no reversal Sinus or ectopic atrial rhythm Right  axis deviation Abnormal T, consider ischemia, lateral leads Confirmed by  Deandrew Hoecker  MD, Shermaine Brigham ([id number]) on 06/10/2014 12:35:01 PM      MDM   Final diagnoses:  None    Maudry Diego, MD 06/10/14 1316

## 2014-06-10 NOTE — H&P (Signed)
[facility] History and Physical  Dustin [name] NWG:[id number] DOB: October 21, 1946 DOA: 06/10/2014  Referring physician: ED physician, Dr. Roderic Palau PCP: Purvis Kilts, MD   Chief Complaint: Chest pain/epigastric pain  HPI: Dustin [name] is a 68 y.o. male with a history of coronary artery disease, diabetes mellitus, and hyperlipidemia, who presents to the emergency department with a chief complaint of substernal chest pain and epigastric abdominal pain. The symptoms started at work today. He was lifting items 5 pounds to 15 pounds. During the lifting, he had a sharp substernal and epigastric abdominal pain which lasted for approximately 15 minutes, but resolved after he sat down to rest. He felt the pain felt as if he was having "gas pains". The pain was not associated with radiation to the left neck, jaw or arm. There was no associated shortness of breath, pleurisy, nausea, or diaphoresis. He denies any recent swelling in his legs. He denies any recent low blood sugars at home. He denies nausea, vomiting, diarrhea, black tarry stools, or bright red blood per rectum. Currently, he is chest pain-free.  In the ED, he is afebrile and bradycardic with a heart rate ranging from 40-59. His blood pressure is within normal limits at 1 3469. His troponin I is within normal limits. His lab data otherwise are significant for hemoglobin of 12.7 and a glucose of 117. His EKG reveals sinus rhythm with PACs and a heart rate of 55 beats per minute. His chest x-ray reveals no acute cardiopulmonary disease. He is being admitted for further evaluation and management.   Review of Systems:  In the history of present illness, otherwise negative.  Past Medical History  Diagnosis Date  . Arteriosclerotic cardiovascular disease (ASCVD) 2003    Non-ST segment elevation MI->  BMS; coronary angiography in 2007-patent stent; nonobstructive LAD  . Hyperlipidemia     Lipid profile: 08/2011-151, 379, 28, 47  .  Diabetes mellitus, type 2     FBS of 104 in 08/2011  . Hypertension     Lab: 08/2011-normal CMet & CBC  . Irritable bowel syndrome   . Obstructive sleep apnea   . Nephrolithiasis   . Diverticulosis   . Hypercholesteremia    Past Surgical History  Procedure Laterality Date  . Colonoscopy  2011    diverticulosis   Social History: He is married. He works in a [employer], trade [employer]. He denies tobacco, alcohol, or illicit drug use.   Allergies  Allergen Reactions  . Morphine And Related   . Ziac [Bisoprolol-Hydrochlorothiazide]     Family History  Problem Relation Age of Onset  . Heart attack Sister   . Heart attack Father   . Heart attack Mother   . Hyperlipidemia Mother   . Hyperlipidemia Father   . Hyperlipidemia Sister   . Diabetes Mother      Prior to Admission medications   Medication Sig Start Date End Date Taking? Authorizing Provider  amLODipine (NORVASC) 5 MG tablet Take 5 mg by mouth daily.   Yes Historical Provider, MD  aspirin EC 81 MG tablet Take 81 mg by mouth daily.   Yes Historical Provider, MD  atorvastatin (LIPITOR) 40 MG tablet Take 40 mg by mouth daily.   Yes Historical Provider, MD  colchicine 0.6 MG tablet Take 0.6 mg by mouth daily as needed (gout flare).    Yes Historical Provider, MD  fenofibrate (TRICOR) 145 MG tablet Take 1 tablet by mouth daily. 05/17/14  Yes Historical Provider, MD  fish oil-omega-3 fatty acids 1000  MG capsule Take 2 g by mouth 3 (three) times daily.    Yes Historical Provider, MD  glipiZIDE (GLUCOTROL) 10 MG tablet Take 1 tablet by mouth daily. 05/17/14  Yes Historical Provider, MD  HYDROcodone-acetaminophen (VICODIN) 5-500 MG per tablet Take 1 tablet by mouth every 6 (six) hours as needed for pain.    Yes Historical Provider, MD  lisinopril-hydrochlorothiazide (PRINZIDE,ZESTORETIC) 20-12.5 MG per tablet Take 1 tablet by mouth daily.   Yes Historical Provider, MD  metFORMIN (GLUCOPHAGE) 500 MG tablet Take 2 tablets by mouth 2 (two) times  daily. 06/02/14  Yes Historical Provider, MD   Physical Exam: Filed Vitals:   06/10/14 1130 06/10/14 1226 06/10/14 1327 06/10/14 1512  BP: 120/70 125/72 116/75 134/69  Pulse: 56 46 49 42  Temp:    98.2 F (36.8 C)  TempSrc:    Oral  Resp: 14 12 16 16   Height:      Weight:      SpO2: 97% 97% 96% 100%    Wt Readings from Last 3 Encounters:  06/10/14 83.915 kg (185 lb)  11/12/11 84.369 kg (186 lb)    General:  Appears calm and comfortable; no acute distress. Eyes: PERRL, normal lids, irises & conjunctiva; conjunctivae are clear and sclerae are white ENT: Oropharynx reveals mildly dry mucous membranes without any posterior exudates or erythema. Nasal mucosa is dry. Neck: no LAD, masses or thyromegaly Cardiovascular: S1, S2, with bradycardia. No LE edema. Telemetry: Sinus bradycardia.  Respiratory: CTA bilaterally, no w/r/r. Normal respiratory effort. Abdomen: Mildly obese, positive bowel sounds, soft, nontender, nondistended. Skin: no rash or induration seen on limited exam Musculoskeletal: grossly normal tone BUE/BLE Psychiatric: grossly normal mood and affect, speech fluent and appropriate Neurologic: grossly non-focal; cranial nerves II through XII are intact.           Labs on Admission:  Basic Metabolic Panel:  Recent Labs Lab 06/10/14 1040  NA 142  K 4.0  CL 104  CO2 23  GLUCOSE 117*  BUN 20  CREATININE 1.10  CALCIUM 9.6   Liver Function Tests: No results found for this basename: AST, ALT, ALKPHOS, BILITOT, PROT, ALBUMIN,  in the last 168 hours No results found for this basename: LIPASE, AMYLASE,  in the last 168 hours No results found for this basename: AMMONIA,  in the last 168 hours CBC:  Recent Labs Lab 06/10/14 1040  WBC 6.1  NEUTROABS 3.7  HGB 12.7*  HCT 37.1*  MCV 91.4  PLT 247   Cardiac Enzymes:  Recent Labs Lab 06/10/14 1040  TROPONINI <0.30    BNP (last 3 results) No results found for this basename: PROBNP,  in the last 8760  hours CBG: No results found for this basename: GLUCAP,  in the last 168 hours  Radiological Exams on Admission: Dg Chest Portable 1 View  06/10/2014   CLINICAL DATA:  Chest pain.  EXAM: PORTABLE CHEST - 1 VIEW  COMPARISON:  April 24, 2006.  FINDINGS: Stable cardiomediastinal silhouette. Both lungs are clear. No pneumothorax or pleural effusion is noted. The visualized skeletal structures are unremarkable.  IMPRESSION: No acute cardiopulmonary abnormality seen.   Electronically Signed   By: Sabino Dick M.D.   On: 06/10/2014 11:27    EKG: Independently reviewed. Sinus rhythm with PACs and a heart rate of 55 beats per minute.  Assessment/Plan Principal Problem:   Chest pain on exertion Active Problems:   Arteriosclerotic cardiovascular disease (ASCVD)   Epigastric pain   Bradycardia   Hyperlipidemia  Diabetes mellitus type 2, controlled, without complications   Hypertension   Anemia, unspecified   1. Chest pain on exertion in a patient with arthrosclerotic cardiovascular disease. His symptomatology is concerning, but it could be secondary to a GI etiology. He certainly has risk factors for angina/ongoing coronary artery disease. He is chest pain-free without any intervention. His normal troponin I is reassuring, but he does have an abnormal EKG, primarily with bradycardia. We will continue his chronic cardiac medications and add when necessary supplemental nitroglycerin. We'll treat his pain with as needed hydromorphone as well. Will consult cardiology for further recommendations and perhaps further investigational study with a stress test. The patient has not had any type of diagnostic workup of his coronary artery disease in approximately 8 years. For additional evaluation, we'll order cardiac enzymes, fasting lipid profile, TSH, and 2-D echocardiogram to rule out vascular abnormalities and to assess for bradycardia. 2. Epigastric abdominal pain. Etiology unclear. He is pain-free now and his  abdominal exam is benign. For further evaluation, we will order ultrasound of his abdomen, hepatic function panel, and a lipase level. We'll start PPI therapy with Protonix. 3. Arthrosclerotic cardiovascular disease/CAD. The patient has a history of a myocardial infarction and stent placement in 2007. As above, we'll consult cardiology for further recommendations and diagnostic workup. 4. Sinus bradycardia. Will order a TSH for further evaluation. As above, 2-D echocardiogram will also be ordered. 5. Hyperlipidemia. We'll continue statin therapy, fibrate, and fish oil. 6. Hypertension. Currently controlled. We'll continue amlodipine and lisinopril/HCTZ. We'll hold off on starting at beta blockade therapy secondary to bradycardia. 7. Type 2 diabetes mellitus without complications. He is treated chronically with metformin and glipizide. These medications will be held for now. We will treat his diabetes with sliding-scale NovoLog. We'll order hemoglobin A1c for further evaluation. 8. Mild normocytic anemia. Will check a total iron level. Continue to monitor.    Code Status: Full code DVT Prophylaxis: Subcutaneous heparin Family Communication: Discussed with wife Disposition Plan: Anticipate discharge to home in the next 24-48 hours  Time spent: One hour and 10 minutes.  [facility] Pager [phone]  **Disclaimer: This note may have been dictated with voice recognition software. Similar sounding words can inadvertently be transcribed and this note may contain transcription errors which may not have been corrected upon publication of note.**

## 2014-06-10 NOTE — Consult Note (Signed)
Cardiology Consultation Note  Patient ID: Dustin [name], MRN: [id number], DOB/AGE: 1946/08/13 68 y.o. Admit date: 06/10/2014   Date of Consult: 06/11/2014 Primary Physician: Purvis Kilts, MD Consulting Cardiologist: Dr. Satira Sark  Chief Complaint: Upper abdominal/lower chest pain radiating into lower belly Reason for Consult: CP  HPI: Dustin [name] is an active 68 y/o M with history of CAD with NSTEMI s/p BMS to Cx/PDA 2003 (patent in 2007, + distal LAD disease) previously followed by Dr. Lattie Haw, HTN, HL, DM2, OSA who presented to [facility] with complaints of chest pain. He has done well since the last OV in 2013. He is very active at his job working as a Chartered certified accountant at [employer] (but does not smoke). He works 12 hours a day doing lots of walking and lifting 10-20 lbs at a time. He denies any recent anginal symptoms. Around 7am yesterday while performing his usual work duties, he developed a sharp epigastric/lower chest discomfort (points to xiphoid area) that radiated into his lower belly associated with sensation of "gas pains." He denies any radiation up into his general chest, nausea, vomiting, diaphoresis, syncope, palpitations or dyspnea. The pain reminded him of his usual acid reflux symptoms. He usually takes antacids for this time of pain with good relief, but did not have any on him. His coworkers urged him to seek care. By the time he had walked all the way out of the plant to the parking lot, his pain had subsided. It was ultimately relieved after passing gas once. In the ER, EKG shows sinus rhythm/sinus bradycardia HR 50s with occasional PAC. Labs reveal troponin neg x 4. A1c 6.7, LDL 77, LFTs unremarkable, lipase wnl. CXR nonacute. He is having an abdominal US right now. He currently denies any symptoms. No recent orthopnea, LEE, PND, bleeding issues.  His chart previously carried a diagnosis of OSA but he denies history of this or prior sleep study. He does snore  quite a bit and nocturnal mild bradycardia into the mid-40s raises further suspicion of this.  Past Medical History  Diagnosis Date  . Coronary atherosclerosis of native coronary artery 2003    a. NSTEMI s/p BMS to distal circumflex and PDA 2003. b. Cath 2007: patent stents with moderate mid to distal LAD disease.  Marland Kitchen Hyperlipidemia   . Diabetes mellitus, type 2   . Essential hypertension, benign   . Irritable bowel syndrome   . Nephrolithiasis   . Diverticulosis   . NSTEMI (non-ST elevated myocardial infarction)       Most Recent Cardiac Studies: Cath 04/2006 DATE OF PROCEDURE: 04/25/2006  DATE OF DISCHARGE:  CARDIAC CATHETERIZATION  INDICATIONS: The patient is a 68 year old gentleman with multiple medical  problems. He has diabetes mellitus. He underwent stenting of both the  distal circumflex and posterior descending artery by me in 2003. He has had  some recurrent chest pain, was evaluated by Dr. Dannielle Burn, and sent in for  cardiac catheterization. There were no definite electrocardiographic  abnormalities. Initial set of enzymes at [facility] was negative.  PROCEDURES:  1. Left heart catheterization.  2. Selective coronary arteriography.  3. Selective left ventriculography.  DESCRIPTION OF PROCEDURE: The patient was brought to the catheterization  laboratory and prepped and draped in the usual fashion. Through an anterior  puncture, the right femoral artery was easily entered. A 6-French sheath  was placed. Views of the left and right coronary arteries were obtained in  multiple angiographic projections. Central aortic and left ventricular  pressures were measured with a pigtail. Ventriculography was performed in  the RAO projection. Because of elevated blood pressures, 2 doses of  intravenous metoprolol were administered. He tolerated the procedure  without complication. We reviewed the films with the patient in the  laboratory, and I subsequently spoke with his girlfriend  afterwards. He was  taken to the holding area in satisfactory clinical condition for direct  sheath removal.  HEMODYNAMIC DATA.:  1. Central aortic pressure 156/85, mean 115, after one dose of intravenous  metoprolol.  2. Left atrial pressure 155/12.  3. No gradient on pullback across the aortic valve.  ANGIOGRAPHIC DATA.:  1. Ventriculography was done in the RAO projection. Overall estimated  ejection fraction was in excess of 55% without segmental wall motion  abnormalities, and there did not appear to be significant mitral  regurgitation.  2. On plain fluoroscopy there is evidence of fairly heavy calcification in  the left anterior descending artery.  3. The right coronary artery is a previously-stented vessel in the PDA  distribution. The proximal, mid and distal right are widely patent.  They are fairly smooth. The posterior descending artery is also patent,  and the previous stent site is about 30 to no more than 40% narrowing  with a minimum lumen diameter well in excess of 2. There is a small  posterolateral system that is free of critical disease.  4. The left main is free of critical disease.  5. The circumflex coronary artery provides essentially three marginal  branches. The circumflex is previously stented after the origin of the  first two marginal branches. There is some mild proximal calcification  but no significant focal narrowing. At the previous site of stenting in  the distal vessel, there is absolutely no evidence of significant  restenosis and the vessel fills the distal vessel quite fully.  6. The left anterior descending artery courses to the apex. There are 3  diagonal branches. 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  This was previously described as 50-60% and does not appear to be  substantially changed,  and we would grade this as 50% and possibly  relate it to intramyocardial compression.  CONCLUSIONS:  1. Preserved overall left ventricular function.  2. Widely patent stent to the distal circumflex.  3. Patent stent to the posterior descending artery.  4. Segmental narrowing of the distal left anterior descending artery  unchanged from the previous study.  I have spoken with Dr. Dannielle Burn. Continued medical therapy will be  recommended. We will get a D-dimer.     Surgical History:  Past Surgical History  Procedure Laterality Date  . Colonoscopy  2011    Diverticulosis     Home Meds: Prior to Admission medications   Medication Sig Start Date End Date Taking? Authorizing Provider  amLODipine (NORVASC) 5 MG tablet Take 5 mg by mouth daily.   Yes Historical Provider, MD  aspirin EC 81 MG tablet Take 81 mg by mouth daily.   Yes Historical Provider, MD  atorvastatin (LIPITOR) 40 MG tablet Take 40 mg by mouth daily.   Yes Historical Provider, MD  colchicine 0.6 MG tablet Take 0.6 mg by mouth daily as needed (gout flare).    Yes Historical Provider, MD  fenofibrate (TRICOR) 145 MG tablet Take 1 tablet by mouth daily. 05/17/14  Yes Historical Provider, MD  fish  oil-omega-3 fatty acids 1000 MG capsule Take 2 g by mouth 3 (three) times daily.    Yes Historical Provider, MD  glipiZIDE (GLUCOTROL) 10 MG tablet Take 1 tablet by mouth daily. 05/17/14  Yes Historical Provider, MD  HYDROcodone-acetaminophen (VICODIN) 5-500 MG per tablet Take 1 tablet by mouth every 6 (six) hours as needed for pain.    Yes Historical Provider, MD  lisinopril-hydrochlorothiazide (PRINZIDE,ZESTORETIC) 20-12.5 MG per tablet Take 1 tablet by mouth daily.   Yes Historical Provider, MD  metFORMIN (GLUCOPHAGE) 500 MG tablet Take 2 tablets by mouth 2 (two) times daily. 06/02/14  Yes Historical Provider, MD    Inpatient Medications:  . amLODipine  5 mg Oral Daily  . aspirin EC  81 mg Oral Daily  . atorvastatin  40 mg Oral QPM    . fenofibrate  160 mg Oral QHS  . heparin  5,000 Units Subcutaneous 3 times per day  . lisinopril  20 mg Oral Daily   And  . hydrochlorothiazide  12.5 mg Oral Daily  . insulin aspart  0-5 Units Subcutaneous QHS  . insulin aspart  0-9 Units Subcutaneous TID WC  . omega-3 acid ethyl esters  2 g Oral TID  . pantoprazole  40 mg Oral Daily   . 0.9 % NaCl with KCl 20 mEq / L 50 mL/hr at 06/10/14 1559    Allergies:  Allergies  Allergen Reactions  . Morphine And Related   . Ziac [Bisoprolol-Hydrochlorothiazide]     History   Social History  . Marital Status: Single    Spouse Name: N/A    Number of Children: 4  . Years of Education: N/A   Occupational History  . [employer]   Social History Main Topics  . Smoking status: Never Smoker   . Smokeless tobacco: Never Used  . Alcohol Use: No  . Drug Use: No  . Sexual Activity: Not on file   Other Topics Concern  . Not on file   Social History Narrative  . No narrative on file     Family History  Problem Relation Age of Onset  . Heart attack Sister   . Heart attack Father   . Heart attack Mother   . Hyperlipidemia Mother   . Hyperlipidemia Father   . Hyperlipidemia Sister   . Diabetes Mother      Review of Systems: General: negative for chills, fever.  Cardiovascular:see above Dermatological: negative for rash Respiratory: negative for cough or wheezing Urologic: negative for hematuria Abdominal: negative for nausea, vomiting, diarrhea, bright red blood per rectum, melena, or hematemesis Neurologic: negative for visual changes, syncope, or dizziness All other systems reviewed and are otherwise negative except as noted above.  Labs:  Recent Labs  06/10/14 1040 06/10/14 1538 06/10/14 2104 06/11/14 0336  TROPONINI <0.30 <0.30 <0.30 <0.30   Lab Results  Component Value Date   WBC 4.8 06/11/2014   HGB 13.2 06/11/2014   HCT 38.7* 06/11/2014   MCV 91.7 06/11/2014   PLT 238 06/11/2014      Recent Labs Lab 06/10/14 1538 06/11/14 0336  NA  --  142  K  --  4.4  CL  --  105  CO2  --  24  BUN  --  18  CREATININE  --  1.02  CALCIUM  --  9.2  PROT 7.0  --   BILITOT 0.5  --   ALKPHOS 32*  --   ALT 19  --   AST 26  --  GLUCOSE  --  139*    Radiology/Studies:  Dg Chest Portable 1 View 06/10/2014   CLINICAL DATA:  Chest pain.  EXAM: PORTABLE CHEST - 1 VIEW  COMPARISON:  April 24, 2006.  FINDINGS: Stable cardiomediastinal silhouette. Both lungs are clear. No pneumothorax or pleural effusion is noted. The visualized skeletal structures are unremarkable.  IMPRESSION: No acute cardiopulmonary abnormality seen.   Electronically Signed   By: Sabino Dick M.D.   On: 06/10/2014 11:27   EKG: sinus bradycardia 55bpm, left axis deviation, no acute changes 8/21 00:21: sinus brady 49bpm, left axis deviation, otherwise no acute changes 8/21 4:41am: sinus brady 48bpm, left axis deviation, PAC, otherwise no acute changes  Physical Exam: Blood pressure 129/65, pulse 72, temperature 98.4 F (36.9 C), temperature source Oral, resp. rate 18, height 5\' 10"  (1.778 m), weight 173 lb 4.5 oz (78.6 kg), SpO2 100.00%. General: Well developed, well nourished WM in no acute distress. Laying flat in bed for abdominal US about 15 degrees Head: Normocephalic, atraumatic, sclera non-icteric, no xanthomas, nares are without discharge.  Neck: Negative for carotid bruits. JVD not elevated. Lungs: Clear bilaterally to auscultation without wheezes, rales, or rhonchi. Breathing is unlabored. Heart: RRR with S1 S2. No murmurs, rubs, or gallops appreciated. Abdomen: Soft, non-tender, non-distended with normoactive bowel sounds. He does have an easily reducible midline abdominal hernia when straining to sit up, nontender, which patient states has been present all his life. (Hernia precautions reviewed.) Msk:  Strength and tone appear normal for age. Extremities: No clubbing or cyanosis. No edema.  Distal pedal  pulses are 2+ and equal bilaterally. Neuro: Alert and oriented X 3. No facial asymmetry. No focal deficit. Moves all extremities spontaneously. Psych:  Responds to questions appropriately with a normal affect.   Assessment and Plan:   1. Epigastric pain, suspect noncardiac, suspect due to acid reflux/gas 2. CAD with NSTEMI 2003 s/p PCI as above 3. HTN, controlled 4. HLD, nearly controlled 5. Diabetes mellitus, well controlled 6. Sinus bradycardia, asymptomatic  Epigastric pain was atypical, improved with passing gas, and ischemic workup has been unrevealing so far. Suspect noncardiac pain. He maintains an active lifestyle without any recent exertional symptoms and risk factors are actually well controlled. Would continue current med regimen. Will discuss possible outpatient stress testing with MD.  He is asymptomatic from his sinus bradycardia with a normal TSH (he is not on a beta blocker due to this). He may also benefit from outpatient sleep study to clarify possible diagnosis of OSA with h/o snoring.  Signed, Melina Copa PA-C 06/11/2014, 8:01 AM    Attending note:  Patient seen and examined. Reviewed records, agree with assessment by Ms. Dunn PA-C. Mr. Reddy presents after an episode of epigastric discomfort as detailed above, typical for his reflux symptoms. He states that he did not have any antacid available at that time. Symptoms were prolonged more so than normal and he came in to be evaluated. There has been no recurrence under observation and  his cardiac markers argue against ACS. ECG shows no significant ST segment changes. He is bradycardic at baseline, asymptomatic.  He was last seen in the office by Dr. Lattie Haw in January 2013, has a remote history of coronary intervention as discussed above, No recent ischemic workup. Echocardiogram is pending. If this study shows no major change in LVEF, he will likely be discharged home later today. We will arrange a followup exercise  Cardiolite and an office visit to reestablish.  Satira Sark, M.D., F.A.C.C.

## 2014-06-10 NOTE — ED Notes (Signed)
Pt states center chest pain which then moved to epigastric area began 59min PTA, lasting 10 minutes. Pt describes pain as "dull, then sharp" Pt denies pain at present. Also denies any other symptoms other than discomfort at the time.

## 2014-06-10 NOTE — ED Notes (Signed)
Pt presents with chest pain; was at work when pain started; denies n/v/sweating. Pt states pain is relieved now but was 8/10 lasting a few minutes and gradually subsided. NAD.

## 2014-06-11 ENCOUNTER — Encounter (HOSPITAL_COMMUNITY): Payer: Self-pay | Admitting: Physician Assistant

## 2014-06-11 DIAGNOSIS — R0683 Snoring: Secondary | ICD-10-CM

## 2014-06-11 DIAGNOSIS — I5189 Other ill-defined heart diseases: Secondary | ICD-10-CM | POA: Diagnosis present

## 2014-06-11 DIAGNOSIS — I519 Heart disease, unspecified: Secondary | ICD-10-CM

## 2014-06-11 DIAGNOSIS — D649 Anemia, unspecified: Secondary | ICD-10-CM

## 2014-06-11 DIAGNOSIS — K219 Gastro-esophageal reflux disease without esophagitis: Secondary | ICD-10-CM | POA: Diagnosis present

## 2014-06-11 DIAGNOSIS — I059 Rheumatic mitral valve disease, unspecified: Secondary | ICD-10-CM

## 2014-06-11 LAB — HEMOGLOBIN A1C
Hgb A1c MFr Bld: 6.7 % — ABNORMAL HIGH (ref <5.7)
MEAN PLASMA GLUCOSE: 146 mg/dL — AB (ref <117)

## 2014-06-11 LAB — LIPID PANEL
Cholesterol: 163 mg/dL (ref 0–200)
HDL: 27 mg/dL — ABNORMAL LOW (ref >39)
LDL CALC: 77 mg/dL (ref 0–99)
Total CHOL/HDL Ratio: 6 RATIO
Triglycerides: 293 mg/dL — ABNORMAL HIGH (ref <150)
VLDL: 59 mg/dL — ABNORMAL HIGH (ref 0–40)

## 2014-06-11 LAB — CBC
HCT: 38.7 % — ABNORMAL LOW (ref 39.0–52.0)
HEMOGLOBIN: 13.2 g/dL (ref 13.0–17.0)
MCH: 31.3 pg (ref 26.0–34.0)
MCHC: 34.1 g/dL (ref 30.0–36.0)
MCV: 91.7 fL (ref 78.0–100.0)
Platelets: 238 10*3/uL (ref 150–400)
RBC: 4.22 MIL/uL (ref 4.22–5.81)
RDW: 12.8 % (ref 11.5–15.5)
WBC: 4.8 10*3/uL (ref 4.0–10.5)

## 2014-06-11 LAB — BASIC METABOLIC PANEL
ANION GAP: 13 (ref 5–15)
BUN: 18 mg/dL (ref 6–23)
CALCIUM: 9.2 mg/dL (ref 8.4–10.5)
CHLORIDE: 105 meq/L (ref 96–112)
CO2: 24 meq/L (ref 19–32)
Creatinine, Ser: 1.02 mg/dL (ref 0.50–1.35)
GFR calc Af Amer: 86 mL/min — ABNORMAL LOW (ref >90)
GFR calc non Af Amer: 74 mL/min — ABNORMAL LOW (ref >90)
Glucose, Bld: 139 mg/dL — ABNORMAL HIGH (ref 70–99)
POTASSIUM: 4.4 meq/L (ref 3.7–5.3)
Sodium: 142 mEq/L (ref 137–147)

## 2014-06-11 LAB — TSH: TSH: 2.15 u[IU]/mL (ref 0.350–4.500)

## 2014-06-11 LAB — GLUCOSE, CAPILLARY
GLUCOSE-CAPILLARY: 183 mg/dL — AB (ref 70–99)
Glucose-Capillary: 156 mg/dL — ABNORMAL HIGH (ref 70–99)

## 2014-06-11 LAB — TROPONIN I: Troponin I: <0.3 ng/mL (ref <0.30)

## 2014-06-11 MED ORDER — PANTOPRAZOLE SODIUM 40 MG PO TBEC: 40.0000 mg | DELAYED_RELEASE_TABLET | Freq: Every day | ORAL | Status: DC

## 2014-06-11 NOTE — Discharge Summary (Signed)
Physician Discharge Summary  Dustin [name] ZSW:[id number] DOB: 04-Feb-1946 DOA: 06/10/2014  PCP: Purvis Kilts, MD  Admit date: 06/10/2014 Discharge date: 06/11/2014  Time spent: Greater than 30  minutes  Recommendations for Outpatient Follow-up:   1. Outpatient stress test considered, per cardiology.  Discharge Diagnoses:  1. Epigastric pain and associated lower substernal chest pain. Myocardial infarction ruled out. 2. Gastroesophageal reflux disease which may have been the etiology of his abdominal/chest pain. 3. History of arthrosclerotic cardiovascular disease. 4. Diastolic dysfunction, grade 2 per 2-D echocardiogram without evidence of heart failure. 5. Sinus bradycardia. Asymptomatic. TSH was within normal limits. 6. Hypertension. Remained controlled. 7. Hyperlipidemia. The patient's total cholesterol was 163, triglycerides 293, HDL 27, and HDL 77. 8. Mild normocytic anemia. Total iron was within normal limits at 102. 9. Type 2 diabetes mellitus, controlled. Hemoglobin A1c 6.7.  Discharge Condition: Improved.  Diet recommendation: Heart healthy/carbohydrate modified.  Filed Weights   06/10/14 1512 06/11/14 0500 06/11/14 0605  Weight: 83.915 kg (185 lb) 79.153 kg (174 lb 8 oz) 78.6 kg (173 lb 4.5 oz)    History of present illness:  The patient is a 68 year old man with a history of coronary artery disease, diabetes mellitus, and hyperlipidemia, who presented to the emergency department on 06/10/2014 with a chief complaint of substernal chest pain and epigastric abdominal pain. In the ED, he was afebrile and bradycardic with a heart rate ranging from 40-59. His blood pressure was within normal limits at 1 3469. His troponin I was within normal limits. His lab data otherwise were significant for hemoglobin of 12.7 and a glucose of 117. His EKG revealed sinus rhythm with PACs and a heart rate of 55 beats per minute. His chest x-ray revealed no acute cardiopulmonary disease. He  was admitted for further evaluation and management.   Hospital Course:  The patient was restarted on his chronic medications including aspirin, statin, fibrate, Norvasc, and lisinopril. Oxygen was provided. Sublingual nitroglycerin was ordered as needed and morphine was ordered as needed for pain. Protonix was started empirically. For further evaluation, a number of studies were ordered. His troponin I was negative x3. The results of the fasting lipid profile were dictated above. His TSH was within normal limits. His 2-D echocardiogram revealed grade 2 diastolic dysfunction, but with an ejection fraction of 55-60%. Because the symptomatology appeared to be more concerning for GI, and lipase and liver transaminases were ordered. They were within normal limits. Subsequently, an ultrasound of his abdomen was ordered and it was unremarkable. Cardiology was consulted. They agreed with medical management. Dr. Domenic Polite felt that the patient's symptoms were not likely the consequence of ACS given his EKG revealed no ST abnormalities and his troponin I was within normal limits. He also went on to say that if the patient's 2-D echocardiogram revealed no major change in his left ventricular ejection fraction, he could be discharged to home and an outpatient Cardiolite stress test will be ordered. As above, the patient's ejection fraction was within normal limits. All of his other chronic medical conditions remained stable. His hemoglobin A1c was noted to be 6.7. His pain completely resolve. He was discharged to home on his chronic medications without change. Protonix was prescribed at the time of discharge.    Procedures: 2-D echocardiogram 06/11/14:Study Conclusions - Left ventricle: The cavity size was normal. Wall thickness was increased in a pattern of mild LVH. Systolic function was normal. The estimated ejection fraction was in the range of 55% to 60%. Wall motion was  normal; there were no regional wall  motion abnormalities. Features are consistent with a pseudonormal left ventricular filling pattern, with concomitant abnormal relaxation and increased filling pressure (grade 2 diastolic dysfunction). - Aortic valve: Mildly calcified annulus. Trileaflet; mildly calcified leaflets. There was no significant regurgitation. - Mitral valve: There was mild regurgitation. - Left atrium: The atrium was mildly dilated. - Right atrium: Central venous pressure (est): 3 mm Hg. - Atrial septum: No defect or patent foramen ovale was identified. - Tricuspid valve: There was trivial regurgitation. - Pulmonary arteries: PA peak pressure: 22 mm Hg (S). - Pericardium, extracardiac: There was no pericardial effusion. Impressions: - Mild LVH with LVEF 38-33%, grade 2 diastolic dysfunction. Mild left atrial enlargement. Mild mitral regurgitation. Mildly sclerotic aortic valve. PASP normal at 22 mmHg. No pericardial effusion.    Consultations:  Cardiology  Discharge Exam: Filed Vitals:   06/11/14 0602  BP: 129/65  Pulse: 72  Temp: 98.4 F (36.9 C)  Resp: 18    General: pleasant 68 year old man sitting up in bed, in no acute distress. Cardiovascular: S1, S2, with bradycardia. Respiratory: clear to auscultation bilaterally. Abdomen: Positive bowel sounds, soft, nontender, nondistended.  Discharge Instructions You were cared for by a hospitalist during your hospital stay. If you have any questions about your discharge medications or the care you received while you were in the hospital after you are discharged, you can call the unit and asked to speak with the hospitalist on call if the hospitalist that took care of you is not available. Once you are discharged, your primary care physician will handle any further medical issues. Please note that NO REFILLS for any discharge medications will be authorized once you are discharged, as it is imperative that you return to your primary care physician (or  establish a relationship with a primary care physician if you do not have one) for your aftercare needs so that they can reassess your need for medications and monitor your lab values.  Discharge Instructions   Diet - low sodium heart healthy    Complete by:  As directed      Diet Carb Modified    Complete by:  As directed      Increase activity slowly    Complete by:  As directed             Medication List         amLODipine 5 MG tablet  Commonly known as:  NORVASC  Take 5 mg by mouth daily.     aspirin EC 81 MG tablet  Take 81 mg by mouth daily.     atorvastatin 40 MG tablet  Commonly known as:  LIPITOR  Take 40 mg by mouth daily.     colchicine 0.6 MG tablet  Take 0.6 mg by mouth daily as needed (gout flare).     fenofibrate 145 MG tablet  Commonly known as:  TRICOR  Take 1 tablet by mouth daily.     fish oil-omega-3 fatty acids 1000 MG capsule  Take 2 g by mouth 3 (three) times daily.     glipiZIDE 10 MG tablet  Commonly known as:  GLUCOTROL  Take 1 tablet by mouth daily.     HYDROcodone-acetaminophen 5-500 MG per tablet  Commonly known as:  VICODIN  Take 1 tablet by mouth every 6 (six) hours as needed for pain.     lisinopril-hydrochlorothiazide 20-12.5 MG per tablet  Commonly known as:  PRINZIDE,ZESTORETIC  Take 1 tablet by mouth daily.  metFORMIN 500 MG tablet  Commonly known as:  GLUCOPHAGE  Take 2 tablets by mouth 2 (two) times daily.     pantoprazole 40 MG tablet  Commonly known as:  PROTONIX  Take 1 tablet (40 mg total) by mouth daily. FOR ACID REFLUX.       Allergies  Allergen Reactions  . Morphine And Related   . Ziac [Bisoprolol-Hydrochlorothiazide]        Follow-up Information   Follow up with [facility] On 06/17/2014. (Register in Radiology Dept at 9:30 am.  Do not eat or drink after midnight.)    Contact information:   [phone]      Follow up with Rozann Lesches, MD On 06/29/2014. (at 1:00 pm)    Specialty:   Cardiology   Contact information:   [place] [address] [phone]        The results of significant diagnostics from this hospitalization (including imaging, microbiology, ancillary and laboratory) are listed below for reference.    Significant Diagnostic Studies: US Abdomen Complete  06/11/2014   CLINICAL DATA:  Pain.  EXAM: ULTRASOUND ABDOMEN COMPLETE  COMPARISON:  CT 08/03/2009.  FINDINGS: Gallbladder:  No gallstones or wall thickening visualized. No sonographic Murphy sign noted.  Common bile duct:  Diameter: 4.1 mm  Liver:  No focal lesion identified. Within normal limits in parenchymal echogenicity.  IVC:  No abnormality visualized.  Pancreas:  Visualized portion unremarkable.  Spleen:  Size and appearance within normal limits.  Right Kidney:  Length: 11.9 cm. Echogenicity within normal limits. No mass or hydronephrosis visualized. Punctate nephrolithiasis cannot be excluded.  Left Kidney:  Length: 10.4 cm. Echogenicity within normal limits. No mass or hydronephrosis visualized. Punctate nephrolithiasis cannot be excluded .  Abdominal aorta:  No aneurysm visualized.  Other findings:  None.  IMPRESSION: 1. No acute abnormality. 2. Bilateral punctate renal calyceal stones cannot be completely excluded .   Electronically Signed   By: Marcello Moores  Register   On: 06/11/2014 08:50   Dg Chest Portable 1 View  06/10/2014   CLINICAL DATA:  Chest pain.  EXAM: PORTABLE CHEST - 1 VIEW  COMPARISON:  April 24, 2006.  FINDINGS: Stable cardiomediastinal silhouette. Both lungs are clear. No pneumothorax or pleural effusion is noted. The visualized skeletal structures are unremarkable.  IMPRESSION: No acute cardiopulmonary abnormality seen.   Electronically Signed   By: Sabino Dick M.D.   On: 06/10/2014 11:27    Microbiology: No results found for this or any previous visit (from the past 240 hour(s)).   Labs: Basic Metabolic Panel:  Recent Labs Lab 06/10/14 1040 06/11/14 0336  NA  142 142  K 4.0 4.4  CL 104 105  CO2 23 24  GLUCOSE 117* 139*  BUN 20 18  CREATININE 1.10 1.02  CALCIUM 9.6 9.2   Liver Function Tests:  Recent Labs Lab 06/10/14 1538  AST 26  ALT 19  ALKPHOS 32*  BILITOT 0.5  PROT 7.0  ALBUMIN 4.1    Recent Labs Lab 06/10/14 1538  LIPASE 35   No results found for this basename: AMMONIA,  in the last 168 hours CBC:  Recent Labs Lab 06/10/14 1040 06/11/14 0336  WBC 6.1 4.8  NEUTROABS 3.7  --   HGB 12.7* 13.2  HCT 37.1* 38.7*  MCV 91.4 91.7  PLT 247 238   Cardiac Enzymes:  Recent Labs Lab 06/10/14 1040 06/10/14 1538 06/10/14 2104 06/11/14 0336  TROPONINI <0.30 <0.30 <0.30 <0.30   BNP: BNP (last 3  results) No results found for this basename: PROBNP,  in the last 8760 hours CBG:  Recent Labs Lab 06/10/14 1725 06/10/14 2125 06/11/14 0733 06/11/14 1116  GLUCAP 172* 113* 156* 183*       Signed:  Christne Platts  [facility] 06/11/2014, 6:36 PM

## 2014-06-11 NOTE — Progress Notes (Signed)
Nutrition Brief Note  Patient identified on the Malnutrition Screening Tool (MST) Report  Wt Readings from Last 15 Encounters:  06/11/14 173 lb 4.5 oz (78.6 kg)  11/12/11 186 lb (84.369 kg)    Body mass index is 24.86 kg/(m^2). Patient meets criteria for normal based on current BMI. Pt weight has decreased over the past 2 years but is not significant for the time frame.  Current diet order is Heart Healthy/CHO Modified, patient is consuming approximately 100% of meals at this time. Labs and medications reviewed.   No nutrition interventions warranted at this time. If nutrition issues arise, please consult RD.   Colman Cater MS,RD,CSG,LDN Office: [phone] Pager: [phone]

## 2014-06-11 NOTE — Progress Notes (Signed)
  Echocardiogram 2D Echocardiogram has been performed.  [place], [place] 06/11/2014, 10:15 AM

## 2014-06-11 NOTE — Progress Notes (Signed)
Pt discharged home today per Dr. Caryn Section. Pt's IV site D/C'd and WNL. Pt's VSS. Pt provided with home medication list, discharge instructions, prescriptions, and excuse from work. Verbalized understanding. Pt left floor via WC in stable condition accompanied by NT.

## 2014-06-11 NOTE — Care Management Note (Signed)
    Page 1 of 1   06/11/2014     1:39:24 PM CARE MANAGEMENT NOTE 06/11/2014  Patient:  Dustin [name], Dustin [name]   Account Number:  [id number]  Date Initiated:  06/11/2014  Documentation initiated by:  Theophilus Kinds  Subjective/Objective Assessment:   Pt admitted from home with CP. Pt lives with his wife and will return home at discharge. Pt is independent with ADl's.     Action/Plan:   No CM needs noted. Pt discharged home today.   Anticipated DC Date:  06/11/2014   Anticipated DC Plan:  [place]  CM consult      Choice offered to / List presented to:             Status of service:  Completed, signed off Medicare Important Message given?   (If response is "NO", the following Medicare IM given date fields will be blank) Date Medicare IM given:   Medicare IM given by:   Date Additional Medicare IM given:   Additional Medicare IM given by:    Discharge Disposition:  HOME/SELF CARE  Per UR Regulation:    If discussed at Long Length of Stay Meetings, dates discussed:    Comments:  06/11/14 [place], RN BSN CM

## 2014-06-14 ENCOUNTER — Other Ambulatory Visit: Payer: Self-pay | Admitting: *Deleted

## 2014-06-14 DIAGNOSIS — R079 Chest pain, unspecified: Secondary | ICD-10-CM

## 2014-06-14 NOTE — Progress Notes (Signed)
Ryan called from [place] asked for exercise NM orders in on 8/24

## 2014-06-17 ENCOUNTER — Encounter (HOSPITAL_COMMUNITY): Payer: Commercial Indemnity

## 2014-06-17 ENCOUNTER — Ambulatory Visit (HOSPITAL_COMMUNITY): Admit: 2014-06-17 | Payer: Commercial Indemnity

## 2014-06-29 ENCOUNTER — Encounter: Payer: Self-pay | Admitting: Cardiology

## 2014-06-29 ENCOUNTER — Encounter: Payer: Self-pay | Admitting: *Deleted

## 2014-06-29 ENCOUNTER — Encounter: Payer: Commercial Indemnity | Admitting: Cardiology

## 2014-06-29 NOTE — Progress Notes (Signed)
NNo-show. This encounter was created in error - please disregard.

## 2014-07-28 ENCOUNTER — Ambulatory Visit (INDEPENDENT_AMBULATORY_CARE_PROVIDER_SITE_OTHER): Payer: Commercial Indemnity | Admitting: Cardiology

## 2014-07-28 ENCOUNTER — Encounter: Payer: Self-pay | Admitting: Cardiology

## 2014-07-28 VITALS — BP 122/68 | HR 76 | Ht 69.0 in | Wt 187.0 lb

## 2014-07-28 DIAGNOSIS — E785 Hyperlipidemia, unspecified: Secondary | ICD-10-CM

## 2014-07-28 MED ORDER — NITROGLYCERIN 0.4 MG SL SUBL: 0.4000 mg | SUBLINGUAL_TABLET | SUBLINGUAL | Status: AC | PRN

## 2014-07-28 NOTE — Progress Notes (Signed)
Clinical Summary Mr. Studstill is a 68 y.o.male seen in consultation at [facility] back in August with episode of epigastric discomfort. He ruled out for myocardial infarction at that time. He was previously followed by Dr. Lattie Haw, last seen in January 2013.  Followup echocardiogram from August showed mild LVH with LVEF 78-24%, grade 2 diastolic dysfunction, mild left atrial enlargement, mild mitral regurgitation, mildly sclerotic aortic valve, PASP 22 mm mercury, no pericardial effusion. We also recommended a followup exercise Cardiolite to reassess CAD and ischemic burden, he did not present for testing.  Lipid panel in August cholesterol 163, triglycerides 293, HDL 27, LDL 77.  He is here with his wife today. He reports no further chest pain symptoms. He states that he is compliant with his medications. We discussed continuing observation on medical therapy versus pursuing followup objective ischemic testing. At this point, he did not want to undergo any further testing.   Allergies  Allergen Reactions  . Morphine And Related   . Ziac [Bisoprolol-Hydrochlorothiazide]     Current Outpatient Prescriptions  Medication Sig Dispense Refill  . amLODipine (NORVASC) 5 MG tablet Take 5 mg by mouth daily.      Marland Kitchen aspirin EC 81 MG tablet Take 81 mg by mouth daily.      Marland Kitchen atorvastatin (LIPITOR) 40 MG tablet Take 40 mg by mouth daily.      . colchicine 0.6 MG tablet Take 0.6 mg by mouth daily as needed (gout flare).       . fenofibrate (TRICOR) 145 MG tablet Take 1 tablet by mouth daily.      . fish oil-omega-3 fatty acids 1000 MG capsule Take 2 g by mouth 3 (three) times daily.       Marland Kitchen glipiZIDE (GLUCOTROL) 10 MG tablet Take 1 tablet by mouth daily.      Marland Kitchen HYDROcodone-acetaminophen (VICODIN) 5-500 MG per tablet Take 1 tablet by mouth every 6 (six) hours as needed for pain.       Marland Kitchen lisinopril-hydrochlorothiazide (PRINZIDE,ZESTORETIC) 20-12.5 MG per tablet Take 1 tablet by mouth daily.      .  metFORMIN (GLUCOPHAGE) 500 MG tablet Take 2 tablets by mouth 2 (two) times daily.      . pantoprazole (PROTONIX) 40 MG tablet Take 1 tablet (40 mg total) by mouth daily. FOR ACID REFLUX.  30 tablet  1  . nitroGLYCERIN (NITROSTAT) 0.4 MG SL tablet Place 1 tablet (0.4 mg total) under the tongue every 5 (five) minutes as needed for chest pain.  25 tablet  3   No current facility-administered medications for this visit.    Past Medical History  Diagnosis Date  . Coronary atherosclerosis of native coronary artery 2003    a. NSTEMI s/p BMS to distal circumflex and PDA 2003. b. Cath 2007: patent stents with moderate mid to distal LAD disease.  Marland Kitchen Hyperlipidemia   . Diabetes mellitus, type 2   . Essential hypertension, benign   . Irritable bowel syndrome   . Nephrolithiasis   . Diverticulosis   . NSTEMI (non-ST elevated myocardial infarction)   . GERD (gastroesophageal reflux disease)   . Abdominal hernia   . Diastolic dysfunction     Grade 2. EF 55-60%    Social History Mr. Hartel reports that he has never smoked. He has never used smokeless tobacco. Mr. Raineri reports that he does not drink alcohol.  Review of Systems No palpitations, dizziness, syncope. No orthopnea or PND. Other systems reviewed and negative.  Physical Examination Filed  Vitals:   07/28/14 1320  BP: 122/68  Pulse: 76   Filed Weights   07/28/14 1320  Weight: 187 lb (84.823 kg)   The patient appears comfortable at rest. HEENT: Conjunctiva and lids normal, oropharynx clear. Neck: Supple, no elevated JVP or carotid bruits, no thyromegaly. Lungs: Clear to auscultation, nonlabored breathing at rest. Cardiac: Regular rate and rhythm, no S3 or significant systolic murmur, no pericardial rub. Abdomen: Soft, nontender, bowel sounds present. Extremities: No pitting edema, distal pulses 2+. Skin: Warm and dry. Musculoskeletal: No kyphosis. Neuropsychiatric: Alert and oriented x3, affect grossly  appropriate.   Problem List and Plan   Coronary atherosclerosis of native coronary artery Symptomatically stable at this point on medical therapy. Patient prefers observation at this time without followup ischemic testing. Prescription for nitroglycerin given. Otherwise continue present regimen, followup in 6 months.  Essential hypertension, benign Blood pressure well controlled today.  Hyperlipidemia Continues on Lipitor, recent LDL 77.    Satira Sark, M.D., F.A.C.C.

## 2014-07-28 NOTE — Assessment & Plan Note (Signed)
Continues on Lipitor, recent LDL 77.

## 2014-07-28 NOTE — Assessment & Plan Note (Signed)
Blood pressure well-controlled today. 

## 2014-07-28 NOTE — Assessment & Plan Note (Signed)
Symptomatically stable at this point on medical therapy. Patient prefers observation at this time without followup ischemic testing. Prescription for nitroglycerin given. Otherwise continue present regimen, followup in 6 months.

## 2014-07-28 NOTE — Patient Instructions (Addendum)
Your physician recommends that you schedule a follow-up appointment in: 6 months with Dr. Domenic Polite  Your physician recommends that you continue on your current medications as directed. Please refer to the Current Medication list given to you today.  I have ordered Nitroglycerin and gave you instructions  Thank you for choosing [facility]!!

## 2015-09-06 ENCOUNTER — Encounter (HOSPITAL_COMMUNITY): Payer: Self-pay | Admitting: Emergency Medicine

## 2015-09-06 ENCOUNTER — Emergency Department (HOSPITAL_COMMUNITY)
Admission: EM | Admit: 2015-09-06 | Discharge: 2015-09-06 | Disposition: A | Discharge status: Home / Self Care | Payer: Managed Care, Other (non HMO) | Attending: Emergency Medicine | Admitting: Emergency Medicine

## 2015-09-06 ENCOUNTER — Emergency Department (HOSPITAL_COMMUNITY): Payer: Managed Care, Other (non HMO)

## 2015-09-06 DIAGNOSIS — K219 Gastro-esophageal reflux disease without esophagitis: Secondary | ICD-10-CM | POA: Insufficient documentation

## 2015-09-06 DIAGNOSIS — Z79899 Other long term (current) drug therapy: Secondary | ICD-10-CM | POA: Diagnosis not present

## 2015-09-06 DIAGNOSIS — S43401A Unspecified sprain of right shoulder joint, initial encounter: Secondary | ICD-10-CM

## 2015-09-06 DIAGNOSIS — Y998 Other external cause status: Secondary | ICD-10-CM | POA: Insufficient documentation

## 2015-09-06 DIAGNOSIS — Y9289 Other specified places as the place of occurrence of the external cause: Secondary | ICD-10-CM | POA: Diagnosis not present

## 2015-09-06 DIAGNOSIS — Z7982 Long term (current) use of aspirin: Secondary | ICD-10-CM | POA: Insufficient documentation

## 2015-09-06 DIAGNOSIS — Z87442 Personal history of urinary calculi: Secondary | ICD-10-CM | POA: Diagnosis not present

## 2015-09-06 DIAGNOSIS — Y9389 Activity, other specified: Secondary | ICD-10-CM | POA: Insufficient documentation

## 2015-09-06 DIAGNOSIS — E785 Hyperlipidemia, unspecified: Secondary | ICD-10-CM | POA: Insufficient documentation

## 2015-09-06 DIAGNOSIS — S4991XA Unspecified injury of right shoulder and upper arm, initial encounter: Secondary | ICD-10-CM | POA: Diagnosis present

## 2015-09-06 DIAGNOSIS — I10 Essential (primary) hypertension: Secondary | ICD-10-CM | POA: Insufficient documentation

## 2015-09-06 DIAGNOSIS — I2510 Atherosclerotic heart disease of native coronary artery without angina pectoris: Secondary | ICD-10-CM | POA: Insufficient documentation

## 2015-09-06 DIAGNOSIS — I252 Old myocardial infarction: Secondary | ICD-10-CM | POA: Insufficient documentation

## 2015-09-06 DIAGNOSIS — W010XXA Fall on same level from slipping, tripping and stumbling without subsequent striking against object, initial encounter: Secondary | ICD-10-CM | POA: Insufficient documentation

## 2015-09-06 DIAGNOSIS — E119 Type 2 diabetes mellitus without complications: Secondary | ICD-10-CM | POA: Diagnosis not present

## 2015-09-06 NOTE — ED Notes (Signed)
Pt states that he was in the woods deer hunting yesterday and tripped and fell and injured right shoulder.

## 2015-09-06 NOTE — Discharge Instructions (Signed)
Shoulder Sprain °A shoulder sprain is a partial or complete tear in one of the tough, fiber-like tissues (ligaments) in the shoulder. The ligaments in the shoulder help to hold the shoulder in place. °CAUSES °This condition may be caused by: °· A fall. °· A hit to the shoulder. °· A twist of the arm. °RISK FACTORS °This condition is more likely to develop in: °· People who play sports. °· People who have problems with balance or coordination. °SYMPTOMS °Symptoms of this condition include: °· Pain when moving the shoulder. °· Limited ability to move the shoulder. °· Swelling and tenderness on top of the shoulder. °· Warmth in the shoulder. °· A change in the shape of the shoulder. °· Redness or bruising on the shoulder. °DIAGNOSIS °This condition is diagnosed with a physical exam. During the exam, you may be asked to do simple exercises with your shoulder. You may also have imaging tests, such as X-rays, MRI, or a CT scan. These tests can show how severe the sprain is. °TREATMENT °This condition may be treated with: °· Rest. °· Pain medicine. °· Ice. °· A sling or brace. This is used to keep the arm still while the shoulder is healing. °· Physical therapy or rehabilitation exercises. These help to improve the range of motion and strength of the shoulder. °· Surgery (rare). Surgery may be needed if the sprain caused a joint to become unstable. Surgery may also be needed to reduce pain. °Some people may develop ongoing shoulder pain or lose some range of motion in the shoulder. However, most people do not develop long-term problems. °HOME CARE INSTRUCTIONS °· Rest. °· Take over-the-counter and prescription medicines only as told by your health care provider. °· If directed, apply ice to the area: °¨ Put ice in a plastic bag. °¨ Place a towel between your skin and the bag. °¨ Leave the ice on for 20 minutes, 2-3 times per day. °· If you were given a shoulder sling or brace: °¨ Wear it as told. °¨ Remove it to shower or  bathe. °¨ Move your arm only as much as told by your health care provider, but keep your hand moving to prevent swelling. °· If you were shown how to do any exercises, do them as told by your health care provider. °· Keep all follow-up visits as told by your health care provider. This is important. °SEEK MEDICAL CARE IF: °· Your pain gets worse. °· Your pain is not relieved with medicines. °· You have increased redness or swelling. °SEEK IMMEDIATE MEDICAL CARE IF: °· You have a fever. °· You cannot move your arm or shoulder. °· You develop numbness or tingling in your arms, hands, or fingers. °  °This information is not intended to replace advice given to you by your health care provider. Make sure you discuss any questions you have with your health care provider. °  °Document Released: 02/24/2009 Document Revised: 06/29/2015 Document Reviewed: 01/31/2015 °Elsevier Interactive Patient Education ©2016 [employer] ° °

## 2015-09-08 NOTE — ED Provider Notes (Signed)
CSN: [id number]     Arrival date & time 09/06/15  1655 History   First MD Initiated Contact with Patient 09/06/15 1749     Chief Complaint  Patient presents with  . Fall  . Shoulder Pain     (Consider location/radiation/quality/duration/timing/severity/associated sxs/prior Treatment) HPI  Dustin [name] is a 69 y.o. male who presents to the Emergency Department complaining of right shoulder pain after a fall.  He states that he tripped and fell onto the anterior chest/shoulder while hunting.  Incident occurred one day prior to ED arrival.  He complains of pain with attempted abduction of the right arm.  Pain improves with the right arm held to his chest.  He has tried heat without relief.  He denies neck or chest pain, head injury, LOC, numbness or weakness of the extremity.   Past Medical History  Diagnosis Date  . Coronary atherosclerosis of native coronary artery 2003    a. NSTEMI s/p BMS to distal circumflex and PDA 2003. b. Cath 2007: patent stents with moderate mid to distal LAD disease.  Marland Kitchen Hyperlipidemia   . Diabetes mellitus, type 2 ([place])   . Essential hypertension, benign   . Irritable bowel syndrome   . Nephrolithiasis   . Diverticulosis   . NSTEMI (non-ST elevated myocardial infarction) ([place])   . GERD (gastroesophageal reflux disease)   . Abdominal hernia   . Diastolic dysfunction     Grade 2. EF 55-60%   Past Surgical History  Procedure Laterality Date  . Colonoscopy  2011    Diverticulosis  . Coronary stent placement     Family History  Problem Relation Age of Onset  . Heart attack Sister   . Heart attack Father   . Heart attack Mother   . Hyperlipidemia Mother   . Hyperlipidemia Father   . Hyperlipidemia Sister   . Diabetes Mother    Social History  Substance Use Topics  . Smoking status: Never Smoker   . Smokeless tobacco: Never Used  . Alcohol Use: No    Review of Systems  Constitutional: Negative for fever and chills.  Genitourinary:  Negative for dysuria.  Musculoskeletal: Positive for arthralgias (right shoulder pain). Negative for back pain, joint swelling and neck pain.  Skin: Negative for color change and wound.  Neurological: Negative for dizziness, syncope, weakness, numbness and headaches.  All other systems reviewed and are negative.     Allergies  Morphine and related and Ziac  Home Medications   Prior to Admission medications   Medication Sig Start Date End Date Taking? Authorizing Provider  amLODipine (NORVASC) 5 MG tablet Take 5 mg by mouth daily.   Yes Historical Provider, MD  aspirin EC 81 MG tablet Take 81 mg by mouth daily.   Yes Historical Provider, MD  atorvastatin (LIPITOR) 40 MG tablet Take 40 mg by mouth daily.   Yes Historical Provider, MD  fenofibrate (TRICOR) 145 MG tablet Take 1 tablet by mouth daily. 05/17/14  Yes Historical Provider, MD  fish oil-omega-3 fatty acids 1000 MG capsule Take 2 g by mouth 3 (three) times daily.    Yes Historical Provider, MD  glipiZIDE (GLUCOTROL) 10 MG tablet Take 1 tablet by mouth daily. 05/17/14  Yes Historical Provider, MD  HYDROcodone-acetaminophen (NORCO/VICODIN) 5-325 MG tablet Take 1 tablet by mouth 4 (four) times daily. 09/02/15  Yes Historical Provider, MD  lisinopril-hydrochlorothiazide (PRINZIDE,ZESTORETIC) 20-12.5 MG per tablet Take 1 tablet by mouth daily.   Yes Historical Provider, MD  metFORMIN (GLUCOPHAGE) 500 MG  tablet Take 2 tablets by mouth 2 (two) times daily. 06/02/14  Yes Historical Provider, MD  nitroGLYCERIN (NITROSTAT) 0.4 MG SL tablet Place 1 tablet (0.4 mg total) under the tongue every 5 (five) minutes as needed for chest pain. 07/28/14  Yes Satira Sark, MD  zolpidem (AMBIEN) 5 MG tablet take 1 tablet by mouth at bedtime for sleep 09/02/15  Yes Historical Provider, MD  pantoprazole (PROTONIX) 40 MG tablet Take 1 tablet (40 mg total) by mouth daily. FOR ACID REFLUX. Patient not taking: Reported on 09/06/2015 06/11/14   Rexene Alberts, MD    BP 133/83 mmHg  Pulse 69  Temp(Src) 98.1 F (36.7 C) (Oral)  Resp 16  Ht 5\' 10"  (1.778 m)  Wt 171 lb (77.565 kg)  BMI 24.54 kg/m2  SpO2 98% Physical Exam  Constitutional: He is oriented to person, place, and time. He appears well-developed and well-nourished. No distress.  HENT:  Head: Normocephalic and atraumatic.  Neck: Normal range of motion. Neck supple.  Cardiovascular: Normal rate and intact distal pulses.   Pulmonary/Chest: Effort normal. No respiratory distress.  Musculoskeletal: He exhibits no edema.  Mild ttp of the anterior right shoulder.  Pain reproduced with abduction.  No edema, step off deformity.  Grip strength strong and symmetrical.  Distal sensation intact  Neurological: He is alert and oriented to person, place, and time. Coordination normal.  Skin: Skin is warm.  Nursing note and vitals reviewed.   ED Course  Procedures (including critical care time) Imaging Review Dg Shoulder Right  09/06/2015  CLINICAL DATA:  Pt states that he was in the woods deer hunting yesterday and tripped and fell and injured right shoulder. Pt states he cant extend his arm and is limited to mobility. Best obtainable images. EXAM: RIGHT SHOULDER - 2+ VIEW COMPARISON:  None. FINDINGS: There is no evidence of fracture or dislocation. There is no evidence of arthropathy or other focal bone abnormality. Soft tissues are unremarkable. IMPRESSION: Negative. Electronically Signed   By: Lucrezia Europe M.D.   On: 09/06/2015 17:32   I have personally reviewed and evaluated these images and lab results as part of my medical decision-making.    MDM   Final diagnoses:  Shoulder sprain, right, initial encounter    Pt is well appearing.  XR neg for fx and dislocation.  Pain to right shoulder likely sprain, but also discussed possibility of rotator cuff or labral injury.  patient appears stable for d/c and verbalized understanding of importance of close orthopedic f/u if sx's aren't improving      Kem Parkinson, PA-C 09/08/15 [place], MD 09/11/15 [phone]

## 2015-09-13 ENCOUNTER — Ambulatory Visit (INDEPENDENT_AMBULATORY_CARE_PROVIDER_SITE_OTHER): Payer: Managed Care, Other (non HMO) | Admitting: Orthopedic Surgery

## 2015-09-13 ENCOUNTER — Encounter: Payer: Self-pay | Admitting: Orthopedic Surgery

## 2015-09-13 ENCOUNTER — Encounter: Payer: Self-pay | Admitting: *Deleted

## 2015-09-13 VITALS — BP 153/99 | Ht 70.0 in | Wt 176.4 lb

## 2015-09-13 DIAGNOSIS — S46011A Strain of muscle(s) and tendon(s) of the rotator cuff of right shoulder, initial encounter: Secondary | ICD-10-CM

## 2015-09-13 NOTE — Progress Notes (Signed)
New eval   Chief Complaint  Patient presents with  . Follow-up    ER Follow up for Right Shoulder Pain   HPI Comments: This is a 69 year old male who was coming out of the woods after deer hunting and tripped and fell and injured his right shoulder. Complains of anterior right shoulder pain and loss of motion in the right shoulder. He says that "it just doesn't move around wanted to."  The pain is described as a mild ache lateral deltoid associated with elevation. Date of injury was 8 days ago.  Review of systems neurologically no numbness and tingling denied, swelling to 9, ecchymosis of the skin denied. Fever denied.  Past Medical History  Diagnosis Date  . Coronary atherosclerosis of native coronary artery 2003    a. NSTEMI s/p BMS to distal circumflex and PDA 2003. b. Cath 2007: patent stents with moderate mid to distal LAD disease.  Marland Kitchen Hyperlipidemia   . Diabetes mellitus, type 2 ([name])   . Essential hypertension, benign   . Irritable bowel syndrome   . Nephrolithiasis   . Diverticulosis   . NSTEMI (non-ST elevated myocardial infarction) ([name])   . GERD (gastroesophageal reflux disease)   . Abdominal hernia   . Diastolic dysfunction     Grade 2. EF 55-60%    BP 153/99 mmHg  Ht 5\' 10"  (1.778 m)  Wt 176 lb 6.4 oz (80.015 kg)  BMI 25.31 kg/m2 Body habitus ectomorphic. Oriented 3 Mood pleasant Gait normal Skin right shoulder normal no rashes no ulcerations no ecchymosis. Sensation in the right arm normal. Pulse normal perfusion normal right upper extremity Lymph nodes negative in the axilla and clavicular region. He does have elevation of 160 and 180 on the left side. He does have tenderness over the right deltoid to deep palpation no tenderness in the anterior posterior joint line or before meals joint. He is a stable in abduction external rotation. There is weakness in the rotator cuff but he says is just because it hurts. Left rotator cuff strength normal.  X-rays were  done at the hospital 3 views of the shoulder I read these independently. There are no bony lesions to suggest a fracture mild arthritis before meals joint  Report suggests glenohumeral arthritis. I didn't really see that  Impression rotator cuff injury can't tell if there is a tear for real or just a strained rotator cuff  Recommend shoulder exercises and a follow-up with me in a couple of weeks.  He is out of work November 28 through December 6. I'll see him on the 6 differential is not better we will do an MRI of the shoulder.

## 2015-09-27 ENCOUNTER — Ambulatory Visit (INDEPENDENT_AMBULATORY_CARE_PROVIDER_SITE_OTHER): Payer: Managed Care, Other (non HMO) | Admitting: Orthopedic Surgery

## 2015-09-27 ENCOUNTER — Encounter: Payer: Self-pay | Admitting: Orthopedic Surgery

## 2015-09-27 DIAGNOSIS — M75101 Unspecified rotator cuff tear or rupture of right shoulder, not specified as traumatic: Secondary | ICD-10-CM

## 2015-09-27 NOTE — Patient Instructions (Signed)
We will order MRI or Arthrogram depending on what Dr says about stent and call you with appt

## 2015-09-28 ENCOUNTER — Telehealth: Payer: Self-pay | Admitting: Cardiology

## 2015-09-28 NOTE — Progress Notes (Signed)
Follow up  Chief Complaint  Patient presents with  . Follow-up    follow up right shoulder s/p exercises    HPI Comments: This is a 69 year old male who was coming out of the woods after deer hunting and tripped and fell and injured his right shoulder. Complains of anterior right shoulder pain and loss of motion in the right shoulder. He says that "it just doesn't move around wanted to."  The pain is described as a mild ache lateral deltoid associated with elevation. Date of injury was 8 days ago.  Review of systems neurologically no numbness and tingling denied, swelling to 9, ecchymosis of the skin denied. Fever denied.  Still c/o pain in the right shoulder with weakness and loss of motion.  BP 151/99 mmHg  Ht 5\' 10"  (1.778 m)  Wt 176 lb 6.4 oz (80.015 kg)  BMI 25.31 kg/m2 Exam: flexion = 100 supra spinatus motor 3/5 Normal appearance aao x 3  Neuro vascular exam intact  Right rot cuff tear   Mri rt shoulder  oow

## 2015-09-28 NOTE — Telephone Encounter (Signed)
Will forward to Dr. McDowell 

## 2015-09-28 NOTE — Telephone Encounter (Signed)
Pt is needing to have a MRI done and he had a Guidant Zeta Stent placed in 09-09-02 and Jamie at Dr. Aline Brochure office is wanting to make sure he's safe to have this test done. Roselyn Reef can be reached at [phone]

## 2015-09-29 ENCOUNTER — Telehealth: Payer: Self-pay | Admitting: Orthopedic Surgery

## 2015-09-29 NOTE — Telephone Encounter (Signed)
No cardiac limitation for him to have an MRI this time.

## 2015-09-29 NOTE — Addendum Note (Signed)
Addended by: Baldomero Lamy B on: 09/29/2015 01:31 PM   Modules accepted: Orders

## 2015-09-29 NOTE — Telephone Encounter (Signed)
Call received from [facility] at [facility], ph# [phone], in response to Dr Ruthe Mannan order of MRI, and clearance needed due to patient's stents.  Verbal approval received, states may proceed with MRI.  Note also in patient's [facility] Epic chart.

## 2015-09-29 NOTE — Telephone Encounter (Signed)
Noted and MRI ordered and given to [facility] for preauthorization

## 2015-09-29 NOTE — Telephone Encounter (Signed)
Notified Carol at Dr. Ruthe Mannan office that pt is able to have MRI.

## 2015-10-14 ENCOUNTER — Ambulatory Visit (HOSPITAL_COMMUNITY)
Admission: RE | Admit: 2015-10-14 | Discharge: 2015-10-14 | Disposition: A | Discharge status: Home / Self Care | Payer: Managed Care, Other (non HMO) | Source: Ambulatory Visit | Attending: Orthopedic Surgery | Admitting: Orthopedic Surgery

## 2015-10-14 DIAGNOSIS — M75101 Unspecified rotator cuff tear or rupture of right shoulder, not specified as traumatic: Secondary | ICD-10-CM

## 2015-10-14 DIAGNOSIS — W19XXXD Unspecified fall, subsequent encounter: Secondary | ICD-10-CM | POA: Diagnosis not present

## 2015-10-14 DIAGNOSIS — M25511 Pain in right shoulder: Secondary | ICD-10-CM | POA: Diagnosis present

## 2015-10-14 DIAGNOSIS — M19011 Primary osteoarthritis, right shoulder: Secondary | ICD-10-CM | POA: Diagnosis not present

## 2015-10-14 DIAGNOSIS — S46011D Strain of muscle(s) and tendon(s) of the rotator cuff of right shoulder, subsequent encounter: Secondary | ICD-10-CM | POA: Diagnosis not present

## 2015-10-14 DIAGNOSIS — S43011D Anterior subluxation of right humerus, subsequent encounter: Secondary | ICD-10-CM | POA: Insufficient documentation

## 2015-10-18 ENCOUNTER — Telehealth: Payer: Self-pay | Admitting: Orthopedic Surgery

## 2015-10-18 NOTE — Telephone Encounter (Signed)
Patient called for MRI results, which he had done at [facility] on 10/14/15; also asking for advice on continuing out of work.  Presently, out of work through 10/24/15.  Please advise - home  Phone# [phone]

## 2015-10-18 NOTE — Telephone Encounter (Signed)
MRI scheduled at [facility] 10/14/15, 5:00pm; patient aware.  Completed as scheduled.

## 2015-10-19 ENCOUNTER — Encounter: Payer: Self-pay | Admitting: Orthopedic Surgery

## 2015-10-19 NOTE — Telephone Encounter (Signed)
Per Dr Aline Brochure  Results are severe tear of the rotator cuff         The patient will be sent to a shoulder specialist to discuss repair versus replacement        Continue out of work until January 30    Patient aware and agreeable, will refer to Dr Mardelle Matte

## 2015-10-19 NOTE — Telephone Encounter (Signed)
Patient is aware of results and agreeable to referral to Dr Mardelle Matte

## 2015-10-26 ENCOUNTER — Telehealth: Payer: Self-pay | Admitting: *Deleted

## 2015-10-26 ENCOUNTER — Other Ambulatory Visit: Payer: Self-pay | Admitting: *Deleted

## 2015-10-26 DIAGNOSIS — M75101 Unspecified rotator cuff tear or rupture of right shoulder, not specified as traumatic: Secondary | ICD-10-CM

## 2015-10-26 NOTE — Telephone Encounter (Signed)
Referral faxed to [facility]alie[facility] Ip for Dr Mardelle Matte

## 2015-11-07 NOTE — Telephone Encounter (Signed)
appt 1/10 w/ Mardelle Matte

## 2015-11-10 ENCOUNTER — Encounter: Payer: Self-pay | Admitting: Neurology

## 2015-11-10 ENCOUNTER — Ambulatory Visit (INDEPENDENT_AMBULATORY_CARE_PROVIDER_SITE_OTHER): Payer: 59 | Admitting: Neurology

## 2015-11-10 ENCOUNTER — Other Ambulatory Visit (INDEPENDENT_AMBULATORY_CARE_PROVIDER_SITE_OTHER): Payer: 59

## 2015-11-10 VITALS — BP 138/88 | HR 69 | Resp 16 | Wt 177.0 lb

## 2015-11-10 DIAGNOSIS — F411 Generalized anxiety disorder: Secondary | ICD-10-CM

## 2015-11-10 DIAGNOSIS — G3184 Mild cognitive impairment, so stated: Secondary | ICD-10-CM

## 2015-11-10 LAB — VITAMIN B12: Vitamin B-12: 335 pg/mL (ref 211–911)

## 2015-11-10 LAB — TSH: TSH: 1.26 u[IU]/mL (ref 0.35–4.50)

## 2015-11-10 MED ORDER — PAROXETINE HCL 10 MG PO TABS: 10.0000 mg | ORAL_TABLET | Freq: Every day | ORAL | Status: DC

## 2015-11-10 NOTE — Patient Instructions (Addendum)
1. Schedule MRI brain without contrast 2. Bloodwork for TSH, B12 3. Start Paxil 10mg  once a day 4. Control of BP, cholesterol, as well as physical exercise and brain stimulation exercises (crossword puzzles, word search) are important for brain health 5. Follow-up in 2 months, call for any problems  YOU HAVE BEEN SCHEDULED AT [facility] FOR MRI ON 11/24/15. PLEASE ARRIVE @ 9:30 AM.    [place], [place] [address]   [phone]

## 2015-11-10 NOTE — Progress Notes (Signed)
NEUROLOGY CONSULTATION NOTE  Dustin [name] MRN: [id number] DOB: August 03, 1946  Referring provider: Dr. Sharilyn Sites Primary care provider:  Dr. Sharilyn Sites  Reason for consult:  Memory, personality changes  Dear Dr Hilma Favors:  Thank you for your kind referral of Dustin [name] for consultation of the above symptoms. Although his history is well known to you, please allow me to reiterate it for the purpose of our medical record. The patient was accompanied to the clinic by his wife who also provides collateral information. Records and images were personally reviewed where available.  HISTORY OF PRESENT ILLNESS: This is a pleasant 70 year old left-handed man with a history of hypertension, hyperlipidemia, diabetes, presenting for evaluation of worsening memory. His wife started noticing changes around summertime 2016, after getting home from a barbeque, he wanted to eat. She said she would heat up the bbq then he started cussing and saying he did not like bbq. His wife reminded him that he ate it earlier, and he did eat it later that day. His wife noticed he was wobbly and sat him down, then he could not get out of the chair and was confused. He did not seek medical attention, he was back to baseline the next day with no recollection of the event. Since then, his wife has noticed that if she says or does something he does not understand, he would get very upset with her. He states he does not remember this, his wife reports "he thinks I am fussing at him." She has seen him standing in the hall looking like he does not know where to go. He occasionally has a distant look and would not respond or would ask her "what's wrong?" They frequently go to the beach and he loves to fish, but one time their alarm system went down and he refused to leave because he was so worried someone would come in their house. When his wife does the laundry, he would not leave the washing machine because he would be afraid a  pipe might burst. He himself reports that "other than just forgetting, memory is pretty good." He puts something down and forgets where he put it. He denies getting lost driving, no missed medications, word-finding difficulties. His wife is in charge of bills. 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 His father had Alzheimer's disease. No history of head injuries or alcohol use. His wife has noticed he drives slowly that she does most of the driving. Over the past couple of years, she noticed he would stop at a green light or go on even with a red light. He denies any headaches, dizziness, diplopia, dysarthria, dysphagia, neck/back pain, focal numbness/tingling/weakness, bowel/bladder dysfunction.  Laboratory Data: Lab Results  Component Value Date   WBC 4.8 06/11/2014   HGB 13.2 06/11/2014   HCT 38.7* 06/11/2014   MCV 91.7 06/11/2014   PLT 238 06/11/2014     Chemistry      Component Value Date/Time   NA 142 06/11/2014 0336   K 4.4 06/11/2014 0336   CL 105 06/11/2014 0336   CO2 24 06/11/2014 0336   BUN 18 06/11/2014 0336   CREATININE 1.02 06/11/2014 0336      Component Value Date/Time   CALCIUM 9.2 06/11/2014 0336   ALKPHOS 32* 06/10/2014 1538   AST 26 06/10/2014 1538   ALT 19 06/10/2014 1538  BILITOT 0.5 06/10/2014 1538       PAST MEDICAL HISTORY: Past Medical History  Diagnosis Date  . Coronary atherosclerosis of native coronary artery 2003    a. NSTEMI s/p BMS to distal circumflex and PDA 2003. b. Cath 2007: patent stents with moderate mid to distal LAD disease.  Marland Kitchen Hyperlipidemia   . Diabetes mellitus, type 2 ([place])   . Essential hypertension, benign   . Irritable bowel syndrome   . Nephrolithiasis   . Diverticulosis   . NSTEMI (non-ST elevated myocardial infarction) ([place])   . GERD (gastroesophageal reflux disease)   . Abdominal hernia   . Diastolic  dysfunction     Grade 2. EF 55-60%    PAST SURGICAL HISTORY: Past Surgical History  Procedure Laterality Date  . Colonoscopy  2011    Diverticulosis  . Coronary stent placement    . Lithotripsy      MEDICATIONS: Current Outpatient Prescriptions on File Prior to Visit  Medication Sig Dispense Refill  . amLODipine (NORVASC) 5 MG tablet Take 5 mg by mouth daily.    Marland Kitchen aspirin EC 81 MG tablet Take 81 mg by mouth daily.    Marland Kitchen atorvastatin (LIPITOR) 40 MG tablet Take 40 mg by mouth daily.    . fenofibrate (TRICOR) 145 MG tablet Take 1 tablet by mouth daily.    . fish oil-omega-3 fatty acids 1000 MG capsule Take 2 g by mouth 3 (three) times daily.     Marland Kitchen glipiZIDE (GLUCOTROL) 10 MG tablet Take 1 tablet by mouth daily.    Marland Kitchen HYDROcodone-acetaminophen (NORCO/VICODIN) 5-325 MG tablet Take 1 tablet by mouth every 6 (six) hours as needed.   0  . lisinopril-hydrochlorothiazide (PRINZIDE,ZESTORETIC) 20-12.5 MG per tablet Take 1 tablet by mouth daily.    . metFORMIN (GLUCOPHAGE) 500 MG tablet Take 2 tablets by mouth 2 (two) times daily.    Marland Kitchen zolpidem (AMBIEN) 5 MG tablet take 1 tablet by mouth at bedtime for sleep as needed  0  . nitroGLYCERIN (NITROSTAT) 0.4 MG SL tablet Place 1 tablet (0.4 mg total) under the tongue every 5 (five) minutes as needed for chest pain. (Patient not taking: Reported on 11/10/2015) 25 tablet 3   No current facility-administered medications on file prior to visit.    ALLERGIES: Allergies  Allergen Reactions  . Morphine And Related Nausea And Vomiting  . Ziac [Bisoprolol-Hydrochlorothiazide]     Unknown reaction    FAMILY HISTORY: Family History  Problem Relation Age of Onset  . Heart attack Sister   . Heart attack Father   . Heart attack Mother   . Hyperlipidemia Mother   . Hyperlipidemia Father   . Hyperlipidemia Sister   . Diabetes Mother     SOCIAL HISTORY: Social History   Social History  . Marital Status: Married    Spouse Name: N/A  . Number of  Children: 4  . Years of Education: N/A   Occupational History  . [employer]   Social History Main Topics  . Smoking status: Never Smoker   . Smokeless tobacco: Never Used  . Alcohol Use: No  . Drug Use: No  . Sexual Activity: Not on file   Other Topics Concern  . Not on file   Social History Narrative    REVIEW OF SYSTEMS: Constitutional: No fevers, chills, or sweats, no generalized fatigue, change in appetite Eyes: No visual changes, double vision, eye pain Ear, nose and throat: No hearing loss, ear pain, nasal congestion, sore throat Cardiovascular:  No chest pain, palpitations Respiratory:  No shortness of breath at rest or with exertion, wheezes GastrointestinaI: No nausea, vomiting, diarrhea, abdominal pain, fecal incontinence Genitourinary:  No dysuria, urinary retention or frequency Musculoskeletal:  No neck pain, back pain Integumentary: No rash, pruritus, skin lesions Neurological: as above Psychiatric: No depression, insomnia, anxiety Endocrine: No palpitations, fatigue, diaphoresis, mood swings, change in appetite, change in weight, increased thirst Hematologic/Lymphatic:  No anemia, purpura, petechiae. Allergic/Immunologic: no itchy/runny eyes, nasal congestion, recent allergic reactions, rashes  PHYSICAL EXAM: Filed Vitals:   11/10/15 1018  BP: 138/88  Pulse: 69  Resp: 16   General: No acute distress Head:  Normocephalic/atraumatic Eyes: Fundoscopic exam shows bilateral sharp discs, no vessel changes, exudates, or hemorrhages Neck: supple, no paraspinal tenderness, full range of motion Back: No paraspinal tenderness Heart: regular rate and rhythm Lungs: Clear to auscultation bilaterally. Vascular: No carotid bruits. Skin/Extremities: No rash, no edema Neurological Exam: Mental status: alert and oriented to person, place,month/year, no dysarthria or aphasia, Fund of knowledge is appropriate.  Recent and remote memory are intact.   Attention and concentration are normal.    Able to name objects and repeat phrases.  MMSE - Mini Mental State Exam 11/10/2015  Orientation to time 3  Orientation to Place 5  Registration 3  Attention/ Calculation 5  Recall 2  Language- name 2 objects 2  Language- repeat 1  Language- follow 3 step command 2  Language- read & follow direction 1  Write a sentence 1  Copy design 1  Total score 26   Cranial nerves: CN I: not tested CN II: pupils equal, round and reactive to light, visual fields intact, fundi unremarkable. CN III, IV, VI:  full range of motion, no nystagmus, no ptosis CN V: facial sensation intact CN VII: upper and lower face symmetric CN VIII: hearing intact to finger rub CN IX, X: gag intact, uvula midline CN XI: sternocleidomastoid and trapezius muscles intact CN XII: tongue midline Bulk & Tone: normal, no fasciculations. Motor: 5/5 throughout with no pronator drift. Sensation: intact to light touch, cold, pin, vibration and joint position sense.  No extinction to double simultaneous stimulation.  Romberg test negative Deep Tendon Reflexes: +2 throughout, no ankle clonus Plantar responses: downgoing bilaterally Cerebellar: no incoordination on finger to nose, heel to shin. No dysdiadochokinesia Gait: narrow-based and steady, able to tandem walk adequately. Tremor: mild high frequency low amplitude postural and endpoint tremor bilaterally  IMPRESSION: This is a pleasant 70 year old right-handed man with vascular risk factors including hypertension, hyperlipidemia, diabetes, presenting for worsening memory loss. His wife reports an episode last summer where he was confused that he is amnestic of. They have also noticed more anxiety recently. Neurological exam non-focal, MMSE today 26/30, indicating mild cognitive impairment. We discussed different causes of memory loss. Check TSH and B12. MRI brain without contrast will be ordered to assess for underlying structural  abnormality and assess vascular load.  We discussed that he may benefit from starting cholinesterase inhibitors such as Aricept, side effects and expectations from the medication were discussed, however with significant anxiety noted, we also discussed pseudodementia and effects of anxiety/depression on memory. He is agreeable to starting Paxil 10mg  daily for anxiety, side effects were discussed. Hold off on Aricept for now. We discussed the importance of control of vascular risk factors, physical exercise, and brain stimulation exercises for brain health. He will follow-up in 2 months and knows to call for any problems.   Thank you for allowing me  to participate in the care of this patient. Please do not hesitate to call for any questions or concerns.   Ellouise Newer, M.D.  CC: Dr. Hilma Favors

## 2015-11-11 ENCOUNTER — Telehealth: Payer: Self-pay | Admitting: Family Medicine

## 2015-11-11 NOTE — Telephone Encounter (Signed)
No answer, will try again later.

## 2015-11-11 NOTE — Telephone Encounter (Signed)
-----   Message from Cameron Sprang, MD sent at 11/11/2015  9:49 AM EST ----- Pls let him know thyroid and B12 level are normal. Thanks

## 2015-11-14 DIAGNOSIS — G3184 Mild cognitive impairment, so stated: Secondary | ICD-10-CM | POA: Insufficient documentation

## 2015-11-14 DIAGNOSIS — F411 Generalized anxiety disorder: Secondary | ICD-10-CM | POA: Insufficient documentation

## 2015-11-14 NOTE — Telephone Encounter (Signed)
Called again and spoke with patient's wife/Lucille and notified her of results.

## 2015-11-22 ENCOUNTER — Telehealth: Payer: Self-pay | Admitting: Neurology

## 2015-11-22 NOTE — Telephone Encounter (Signed)
I spoke with the patients wife she wanted to make you aware that patient stopped the Paxil after taking it for a week. She states every time he took it he became "hateful". She states she wasn't sure if it was the medicine at first so she didn't give it to him for 1 night and he did better, then she gave him the medication the next night and he was acting the same way so they stopped it altogether.

## 2015-11-22 NOTE — Telephone Encounter (Signed)
Pt wife lucille called and wants to talk to someone about medication and test please call [phone] before 12 and after 12 [phone]. The medication was making him hateful and he stopped taking it

## 2015-11-22 NOTE — Telephone Encounter (Signed)
Noted. Do they want to try a different medication or hold off for now? If they would like to try a different one, would start Lexapro 10mg  daily and follow-up in 2 months. If he is not starting a new medication, can move follow-up to September. Thanks

## 2015-11-22 NOTE — Telephone Encounter (Signed)
Called and spoke to patients wife again and she states that for right now they will hold off on trying a different medication. She states she will call us if anything changes with the patient.

## 2015-11-28 ENCOUNTER — Encounter (INDEPENDENT_AMBULATORY_CARE_PROVIDER_SITE_OTHER): Payer: Self-pay | Admitting: *Deleted

## 2016-01-20 ENCOUNTER — Ambulatory Visit: Payer: 59 | Admitting: Neurology

## 2016-01-20 DIAGNOSIS — Z029 Encounter for administrative examinations, unspecified: Secondary | ICD-10-CM

## 2016-03-14 DIAGNOSIS — E119 Type 2 diabetes mellitus without complications: Secondary | ICD-10-CM | POA: Diagnosis not present

## 2016-05-03 DIAGNOSIS — Z6825 Body mass index (BMI) 25.0-25.9, adult: Secondary | ICD-10-CM | POA: Diagnosis not present

## 2016-05-03 DIAGNOSIS — E782 Mixed hyperlipidemia: Secondary | ICD-10-CM | POA: Diagnosis not present

## 2016-05-03 DIAGNOSIS — E663 Overweight: Secondary | ICD-10-CM | POA: Diagnosis not present

## 2016-05-03 DIAGNOSIS — Z1389 Encounter for screening for other disorder: Secondary | ICD-10-CM | POA: Diagnosis not present

## 2016-05-03 DIAGNOSIS — I10 Essential (primary) hypertension: Secondary | ICD-10-CM | POA: Diagnosis not present

## 2016-06-19 DIAGNOSIS — R413 Other amnesia: Secondary | ICD-10-CM | POA: Diagnosis not present

## 2016-06-19 DIAGNOSIS — Z1389 Encounter for screening for other disorder: Secondary | ICD-10-CM | POA: Diagnosis not present

## 2016-06-19 DIAGNOSIS — I10 Essential (primary) hypertension: Secondary | ICD-10-CM | POA: Diagnosis not present

## 2016-06-19 DIAGNOSIS — E538 Deficiency of other specified B group vitamins: Secondary | ICD-10-CM | POA: Diagnosis not present

## 2016-06-19 DIAGNOSIS — E039 Hypothyroidism, unspecified: Secondary | ICD-10-CM | POA: Diagnosis not present

## 2016-06-19 DIAGNOSIS — E782 Mixed hyperlipidemia: Secondary | ICD-10-CM | POA: Diagnosis not present

## 2016-06-19 DIAGNOSIS — Z6824 Body mass index (BMI) 24.0-24.9, adult: Secondary | ICD-10-CM | POA: Diagnosis not present

## 2016-06-19 DIAGNOSIS — E1129 Type 2 diabetes mellitus with other diabetic kidney complication: Secondary | ICD-10-CM | POA: Diagnosis not present

## 2016-06-19 DIAGNOSIS — M1991 Primary osteoarthritis, unspecified site: Secondary | ICD-10-CM | POA: Diagnosis not present

## 2016-07-10 ENCOUNTER — Other Ambulatory Visit (HOSPITAL_COMMUNITY): Payer: Self-pay | Admitting: Internal Medicine

## 2016-07-10 DIAGNOSIS — R413 Other amnesia: Secondary | ICD-10-CM

## 2016-08-01 ENCOUNTER — Encounter: Payer: Self-pay | Admitting: Neurology

## 2016-08-01 ENCOUNTER — Ambulatory Visit (INDEPENDENT_AMBULATORY_CARE_PROVIDER_SITE_OTHER): Payer: Medicare Other | Admitting: Neurology

## 2016-08-01 VITALS — BP 142/78 | HR 86 | Temp 98.2°F | Ht 70.0 in | Wt 170.0 lb

## 2016-08-01 DIAGNOSIS — F0390 Unspecified dementia without behavioral disturbance: Secondary | ICD-10-CM

## 2016-08-01 DIAGNOSIS — F03A0 Unspecified dementia, mild, without behavioral disturbance, psychotic disturbance, mood disturbance, and anxiety: Secondary | ICD-10-CM

## 2016-08-01 MED ORDER — DONEPEZIL HCL 10 MG PO TABS: ORAL_TABLET | ORAL | 11 refills | Status: DC

## 2016-08-01 NOTE — Patient Instructions (Signed)
1. Start Aricept 10mg : Take 1/2 tablet daily for 1 month, then increase to 1 tablet daily 2. Recommend seeing a psychiatrist for the mood, please let us know once you decide 3. Proceed with MRI brain as scheduled 4. Control of blood pressure, cholesterol, diabetes, as well as physical exercise and brain stimulation exercises are important for brain health 5. Follow-up in 6 months, call for any changes

## 2016-08-01 NOTE — Progress Notes (Signed)
NEUROLOGY FOLLOW UP OFFICE NOTE  Dustin [name] OZ:[id number]  HISTORY OF PRESENT ILLNESS: I had the pleasure of seeing Dustin [name] in follow-up in the neurology clinic on 08/01/2016.  The patient was last seen 9 months ago for mild cognitive impairment, MMSE in January 2017 was 26/30. Records and images were personally reviewed where available. TSH and B12 normal. He is scheduled for the brain MRI this month. His wife's main concern were behavioral changes, he was started on Paxil, but his wife called to report that each time he took the medication he became "hateful." She did not give it one day and he was better, then again became "hateful" when she restarted it. He has been off Paxil since January. He himself reports that he is "hateful sometimes," his wife reports this has worsened. He "just thinks up stuff." He continuously worries that they will go broke (this is not the case) and thinks their well will go dry if she washes the car. He has noticed worsening memory, he would have to bring a conversation to an end because he forgets what he was talking about, then it comes back 30 minutes later. He has left a food bowl soaking in the bathroom sink. His wife administers his medications because he was having a hard time doing this by himself. She is in charge of bill payments. He drives short distances without difficulties, his wife feels okay with his driving. He has retired since his last visit. He denies any headaches, dizziness, diplopia, dysarthria, dysphagia, neck/back pain, focal numbness/tingling/weakness, bowel/bladder dysfunction.  HPI 11/10/2015: This is a pleasant 70 yo LH man with a history of hypertension, hyperlipidemia, diabetes, who presented with worsening memory. His wife started noticing changes around summertime 2016, after getting home from a barbeque, he wanted to eat. She said she would heat up the bbq then he started cussing and saying he did not like bbq. His wife reminded him  that he ate it earlier, and he did eat it later that day. His wife noticed he was wobbly and sat him down, then he could not get out of the chair and was confused. He did not seek medical attention, he was back to baseline the next day with no recollection of the event. Since then, his wife has noticed that if she says or does something he does not understand, he would get very upset with her. He states he does not remember this, his wife reports "he thinks I am fussing at him." She has seen him standing in the hall looking like he does not know where to go. He occasionally has a distant look and would not respond or would ask her "what's wrong?" They frequently go to the beach and he loves to fish, but one time their alarm system went down and he refused to leave because he was so worried someone would come in their house. When his wife does the laundry, he would not leave the washing machine because he would be afraid a pipe might burst. He himself reports that "other than just forgetting, memory is pretty good." He puts something down and forgets where he put it. He denies getting lost driving, no missed medications, word-finding difficulties. His wife is in charge of bills. 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 His father had Alzheimer's disease. No history of head injuries or alcohol use.  His wife has noticed he drives slowly that she does most of the driving. Over the past couple of years, she noticed he would stop at a green light or go on even with a red light.   PAST MEDICAL HISTORY: Past Medical History:  Diagnosis Date  . Abdominal hernia   . Coronary atherosclerosis of native coronary artery 2003   a. NSTEMI s/p BMS to distal circumflex and PDA 2003. b. Cath 2007: patent stents with moderate mid to distal LAD disease.  . Diabetes mellitus, type 2 ([place])   . Diastolic  dysfunction    Grade 2. EF 55-60%  . Diverticulosis   . Essential hypertension, benign   . GERD (gastroesophageal reflux disease)   . Hyperlipidemia   . Irritable bowel syndrome   . Nephrolithiasis   . NSTEMI (non-ST elevated myocardial infarction) [facility])     MEDICATIONS: Current Outpatient Prescriptions on File Prior to Visit  Medication Sig Dispense Refill  . amLODipine (NORVASC) 5 MG tablet Take 5 mg by mouth daily.    Marland Kitchen aspirin EC 81 MG tablet Take 81 mg by mouth daily.    Marland Kitchen atorvastatin (LIPITOR) 40 MG tablet Take 40 mg by mouth daily.    Marland Kitchen azithromycin (ZITHROMAX) 250 MG tablet Take as directed    . fenofibrate (TRICOR) 145 MG tablet Take 1 tablet by mouth daily.    . fish oil-omega-3 fatty acids 1000 MG capsule Take 2 g by mouth 3 (three) times daily.     Marland Kitchen glipiZIDE (GLUCOTROL) 10 MG tablet Take 1 tablet by mouth daily.    Marland Kitchen HYDROcodone-acetaminophen (NORCO/VICODIN) 5-325 MG tablet Take 1 tablet by mouth every 6 (six) hours as needed.   0  . lisinopril-hydrochlorothiazide (PRINZIDE,ZESTORETIC) 20-12.5 MG per tablet Take 1 tablet by mouth daily.    . metFORMIN (GLUCOPHAGE) 500 MG tablet Take 2 tablets by mouth 2 (two) times daily.    . nitroGLYCERIN (NITROSTAT) 0.4 MG SL tablet Place 1 tablet (0.4 mg total) under the tongue every 5 (five) minutes as needed for chest pain. (Patient not taking: Reported on 69/19/2017) 25 tablet 3  . PARoxetine (PAXIL) 10 MG tablet Take 1 tablet (10 mg total) by mouth daily. 30 tablet 4  . PROAIR RESPICLICK 123XX123 (90 Base) MCG/ACT AEPB 2 puffs once daily  0  . zolpidem (AMBIEN) 5 MG tablet take 1 tablet by mouth at bedtime for sleep as needed  0   No current facility-administered medications on file prior to visit.     ALLERGIES: Allergies  Allergen Reactions  . Morphine And Related Nausea And Vomiting  . Ziac [Bisoprolol-Hydrochlorothiazide]     Unknown reaction    FAMILY HISTORY: Family History  Problem Relation Age of Onset  . Heart  attack Sister   . Heart attack Father   . Heart attack Mother   . Hyperlipidemia Mother   . Hyperlipidemia Father   . Hyperlipidemia Sister   . Diabetes Mother     SOCIAL HISTORY: Social History   Social History  . Marital status: Married    Spouse name: N/A  . Number of children: 4  . Years of education: N/A   Occupational History  . [employer]   Social History Main Topics  . Smoking status: Never Smoker  . Smokeless tobacco: Never Used  . Alcohol use No  . Drug use: No  . Sexual activity: Not on file   Other Topics Concern  . Not on file   Social History Narrative  .  No narrative on file    REVIEW OF SYSTEMS: Constitutional: No fevers, chills, or sweats, no generalized fatigue, change in appetite Eyes: No visual changes, double vision, eye pain Ear, nose and throat: No hearing loss, ear pain, nasal congestion, sore throat Cardiovascular: No chest pain, palpitations Respiratory:  No shortness of breath at rest or with exertion, wheezes GastrointestinaI: No nausea, vomiting, diarrhea, abdominal pain, fecal incontinence Genitourinary:  No dysuria, urinary retention or frequency Musculoskeletal:  No neck pain, back pain Integumentary: No rash, pruritus, skin lesions Neurological: as above Psychiatric: No depression, insomnia, anxiety Endocrine: No palpitations, fatigue, diaphoresis, mood swings, change in appetite, change in weight, increased thirst Hematologic/Lymphatic:  No anemia, purpura, petechiae. Allergic/Immunologic: no itchy/runny eyes, nasal congestion, recent allergic reactions, rashes  PHYSICAL EXAM: Vitals:   08/01/16 1331  BP: (!) 142/78  Pulse: 86  Temp: 98.2 F (36.8 C)   General: No acute distress Head:  Normocephalic/atraumatic Neck: supple, no paraspinal tenderness, full range of motion Heart:  Regular rate and rhythm Lungs:  Clear to auscultation bilaterally Back: No paraspinal tenderness Skin/Extremities: No  rash, no edema Neurological Exam: alert and oriented to person, place, and time. No aphasia or dysarthria. Fund of knowledge is appropriate.  Recent and remote memory are intact.  Attention and concentration are normal.    Able to name objects and repeat phrases. CDT 4/5 MMSE - Mini Mental State Exam 08/01/2016 11/10/2015  Orientation to time 3 3  Orientation to Place 5 5  Registration 3 3  Attention/ Calculation 3 5  Recall 0 2  Language- name 2 objects 2 2  Language- repeat 1 1  Language- follow 3 step command 3 2  Language- read & follow direction 1 1  Write a sentence 1 1  Copy design 1 1  Total score 23 26    Cranial nerves: Pupils equal, round, reactive to light.  Extraocular movements intact with no nystagmus. Visual fields full. Facial sensation intact. No facial asymmetry. Tongue, uvula, palate midline.  Motor: Bulk and tone normal, muscle strength 5/5 throughout with no pronator drift.  Sensation to light touch intact.  No extinction to double simultaneous stimulation.  Deep tendon reflexes 2+ throughout, toes downgoing.  Finger to nose testing intact.  Gait narrow-based and steady, able to tandem walk adequately.  Romberg negative.  IMPRESSION: This is a pleasant 70 yo LH man with vascular risk factors including hypertension, hyperlipidemia, diabetes, presenting for worsening memory loss. His MMSE today is 23/30 (26/30 in January 2017), indicating mild dementia. They are also reporting more personality and behavioral changes. He is scheduled for an MRI brain this month. He will start Aricept 10mg  1/2 tablet daily for 1 month, then increase to 1 tablet daily, side effects were discussed. We discussed his mood issues/anxiety, he became "more hateful" on the Paxil. Discussed referral to a geriatric psychiatrist, his wife would like to think about this. I discussed that with his increased agitation, would recommend we proceed with referral, she will call us back on their decision. We also  discussed the importance of control of vascular risk factors, physical exercise, and brain stimulation exercises for brain health. He will follow-up in 6 months and knows to call for any problems.   Thank you for allowing me to participate in his care.  Please do not hesitate to call for any questions or concerns.  The duration of this appointment visit was 25 minutes of face-to-face time with the patient.  Greater than 50% of this time was spent  in counseling, explanation of diagnosis, planning of further management, and coordination of care.   Ellouise Newer, M.D.   CC: Dr. Hilma Favors

## 2016-08-07 ENCOUNTER — Ambulatory Visit (HOSPITAL_COMMUNITY)
Admission: RE | Admit: 2016-08-07 | Discharge: 2016-08-07 | Disposition: A | Discharge status: Home / Self Care | Payer: Medicare Other | Source: Ambulatory Visit | Attending: Internal Medicine | Admitting: Internal Medicine

## 2016-08-07 DIAGNOSIS — R413 Other amnesia: Secondary | ICD-10-CM

## 2016-08-07 DIAGNOSIS — G319 Degenerative disease of nervous system, unspecified: Secondary | ICD-10-CM | POA: Diagnosis not present

## 2016-09-19 DIAGNOSIS — E119 Type 2 diabetes mellitus without complications: Secondary | ICD-10-CM | POA: Diagnosis not present

## 2016-09-19 DIAGNOSIS — Z0000 Encounter for general adult medical examination without abnormal findings: Secondary | ICD-10-CM | POA: Diagnosis not present

## 2016-09-19 DIAGNOSIS — F0390 Unspecified dementia without behavioral disturbance: Secondary | ICD-10-CM | POA: Diagnosis not present

## 2016-09-19 DIAGNOSIS — Z6825 Body mass index (BMI) 25.0-25.9, adult: Secondary | ICD-10-CM | POA: Diagnosis not present

## 2016-09-19 DIAGNOSIS — K219 Gastro-esophageal reflux disease without esophagitis: Secondary | ICD-10-CM | POA: Diagnosis not present

## 2016-09-19 DIAGNOSIS — I10 Essential (primary) hypertension: Secondary | ICD-10-CM | POA: Diagnosis not present

## 2016-09-19 DIAGNOSIS — E1129 Type 2 diabetes mellitus with other diabetic kidney complication: Secondary | ICD-10-CM | POA: Diagnosis not present

## 2016-09-19 DIAGNOSIS — I2510 Atherosclerotic heart disease of native coronary artery without angina pectoris: Secondary | ICD-10-CM | POA: Diagnosis not present

## 2016-11-15 DIAGNOSIS — E1129 Type 2 diabetes mellitus with other diabetic kidney complication: Secondary | ICD-10-CM | POA: Diagnosis not present

## 2016-11-15 DIAGNOSIS — M1991 Primary osteoarthritis, unspecified site: Secondary | ICD-10-CM | POA: Diagnosis not present

## 2016-11-15 DIAGNOSIS — D6859 Other primary thrombophilia: Secondary | ICD-10-CM | POA: Diagnosis not present

## 2016-11-15 DIAGNOSIS — E119 Type 2 diabetes mellitus without complications: Secondary | ICD-10-CM | POA: Diagnosis not present

## 2016-11-15 DIAGNOSIS — E663 Overweight: Secondary | ICD-10-CM | POA: Diagnosis not present

## 2016-11-15 DIAGNOSIS — E538 Deficiency of other specified B group vitamins: Secondary | ICD-10-CM | POA: Diagnosis not present

## 2016-11-15 DIAGNOSIS — Z6826 Body mass index (BMI) 26.0-26.9, adult: Secondary | ICD-10-CM | POA: Diagnosis not present

## 2016-11-15 DIAGNOSIS — R5383 Other fatigue: Secondary | ICD-10-CM | POA: Diagnosis not present

## 2016-11-15 DIAGNOSIS — I10 Essential (primary) hypertension: Secondary | ICD-10-CM | POA: Diagnosis not present

## 2016-11-15 DIAGNOSIS — Z1389 Encounter for screening for other disorder: Secondary | ICD-10-CM | POA: Diagnosis not present

## 2016-12-24 DIAGNOSIS — K219 Gastro-esophageal reflux disease without esophagitis: Secondary | ICD-10-CM | POA: Diagnosis not present

## 2016-12-24 DIAGNOSIS — E782 Mixed hyperlipidemia: Secondary | ICD-10-CM | POA: Diagnosis not present

## 2016-12-24 DIAGNOSIS — Z6826 Body mass index (BMI) 26.0-26.9, adult: Secondary | ICD-10-CM | POA: Diagnosis not present

## 2016-12-24 DIAGNOSIS — E119 Type 2 diabetes mellitus without complications: Secondary | ICD-10-CM | POA: Diagnosis not present

## 2016-12-24 DIAGNOSIS — E663 Overweight: Secondary | ICD-10-CM | POA: Diagnosis not present

## 2016-12-31 DIAGNOSIS — Z6826 Body mass index (BMI) 26.0-26.9, adult: Secondary | ICD-10-CM | POA: Diagnosis not present

## 2016-12-31 DIAGNOSIS — E663 Overweight: Secondary | ICD-10-CM | POA: Diagnosis not present

## 2016-12-31 DIAGNOSIS — J9801 Acute bronchospasm: Secondary | ICD-10-CM | POA: Diagnosis not present

## 2016-12-31 DIAGNOSIS — J40 Bronchitis, not specified as acute or chronic: Secondary | ICD-10-CM | POA: Diagnosis not present

## 2016-12-31 DIAGNOSIS — I10 Essential (primary) hypertension: Secondary | ICD-10-CM | POA: Diagnosis not present

## 2016-12-31 DIAGNOSIS — M1991 Primary osteoarthritis, unspecified site: Secondary | ICD-10-CM | POA: Diagnosis not present

## 2016-12-31 DIAGNOSIS — J329 Chronic sinusitis, unspecified: Secondary | ICD-10-CM | POA: Diagnosis not present

## 2016-12-31 DIAGNOSIS — G894 Chronic pain syndrome: Secondary | ICD-10-CM | POA: Diagnosis not present

## 2017-02-18 DIAGNOSIS — M1000 Idiopathic gout, unspecified site: Secondary | ICD-10-CM | POA: Diagnosis not present

## 2017-02-18 DIAGNOSIS — Z6825 Body mass index (BMI) 25.0-25.9, adult: Secondary | ICD-10-CM | POA: Diagnosis not present

## 2017-02-22 DIAGNOSIS — Z1211 Encounter for screening for malignant neoplasm of colon: Secondary | ICD-10-CM | POA: Diagnosis not present

## 2017-03-29 DIAGNOSIS — K219 Gastro-esophageal reflux disease without esophagitis: Secondary | ICD-10-CM | POA: Diagnosis not present

## 2017-03-29 DIAGNOSIS — E663 Overweight: Secondary | ICD-10-CM | POA: Diagnosis not present

## 2017-03-29 DIAGNOSIS — R201 Hypoesthesia of skin: Secondary | ICD-10-CM | POA: Diagnosis not present

## 2017-03-29 DIAGNOSIS — E1165 Type 2 diabetes mellitus with hyperglycemia: Secondary | ICD-10-CM | POA: Diagnosis not present

## 2017-03-29 DIAGNOSIS — M1991 Primary osteoarthritis, unspecified site: Secondary | ICD-10-CM | POA: Diagnosis not present

## 2017-03-29 DIAGNOSIS — E1129 Type 2 diabetes mellitus with other diabetic kidney complication: Secondary | ICD-10-CM | POA: Diagnosis not present

## 2017-03-29 DIAGNOSIS — E1140 Type 2 diabetes mellitus with diabetic neuropathy, unspecified: Secondary | ICD-10-CM | POA: Diagnosis not present

## 2017-03-29 DIAGNOSIS — M1A00X0 Idiopathic chronic gout, unspecified site, without tophus (tophi): Secondary | ICD-10-CM | POA: Diagnosis not present

## 2017-03-29 DIAGNOSIS — I10 Essential (primary) hypertension: Secondary | ICD-10-CM | POA: Diagnosis not present

## 2017-03-29 DIAGNOSIS — L84 Corns and callosities: Secondary | ICD-10-CM | POA: Diagnosis not present

## 2017-03-29 DIAGNOSIS — G894 Chronic pain syndrome: Secondary | ICD-10-CM | POA: Diagnosis not present

## 2017-03-29 DIAGNOSIS — Z6825 Body mass index (BMI) 25.0-25.9, adult: Secondary | ICD-10-CM | POA: Diagnosis not present

## 2017-07-01 DIAGNOSIS — M1991 Primary osteoarthritis, unspecified site: Secondary | ICD-10-CM | POA: Diagnosis not present

## 2017-07-01 DIAGNOSIS — Z6825 Body mass index (BMI) 25.0-25.9, adult: Secondary | ICD-10-CM | POA: Diagnosis not present

## 2017-07-01 DIAGNOSIS — I10 Essential (primary) hypertension: Secondary | ICD-10-CM | POA: Diagnosis not present

## 2017-07-01 DIAGNOSIS — Z1389 Encounter for screening for other disorder: Secondary | ICD-10-CM | POA: Diagnosis not present

## 2017-07-01 DIAGNOSIS — E119 Type 2 diabetes mellitus without complications: Secondary | ICD-10-CM | POA: Diagnosis not present

## 2017-07-01 DIAGNOSIS — E663 Overweight: Secondary | ICD-10-CM | POA: Diagnosis not present

## 2017-07-01 DIAGNOSIS — F0390 Unspecified dementia without behavioral disturbance: Secondary | ICD-10-CM | POA: Diagnosis not present

## 2017-07-04 ENCOUNTER — Telehealth: Payer: Self-pay | Admitting: Neurology

## 2017-07-04 NOTE — Telephone Encounter (Signed)
Patient's wife stopped by the office to drop off power of attorney papers as well as needing a letter from Dr. Delice Lesch stating that he cannot make any Legal decisions. She said he is also terrified of being in traffic and crowds. Please call. She said the letter can be mailed to her. Thanks

## 2017-07-17 ENCOUNTER — Telehealth: Payer: Self-pay

## 2017-07-17 NOTE — Telephone Encounter (Signed)
Spoke with pt's wife relaying message below.  She states that she does not wish to schedule at this time.  Pt gets upset during drives to [place], thinking wife is "trying to put him away".  She says that she will speak with pt on one of his good days and call our office if they wish to proceed with appointment

## 2017-07-17 NOTE — Telephone Encounter (Signed)
ERROR

## 2017-07-17 NOTE — Telephone Encounter (Signed)
He has not been seen in almost a year, will need re-eval before we can write any letters. Thanks

## 2017-07-24 ENCOUNTER — Ambulatory Visit (INDEPENDENT_AMBULATORY_CARE_PROVIDER_SITE_OTHER): Payer: Medicare Other | Admitting: Neurology

## 2017-07-24 ENCOUNTER — Encounter: Payer: Self-pay | Admitting: Neurology

## 2017-07-24 VITALS — BP 130/76 | HR 74 | Ht 70.0 in | Wt 165.0 lb

## 2017-07-24 DIAGNOSIS — F0391 Unspecified dementia with behavioral disturbance: Secondary | ICD-10-CM | POA: Diagnosis not present

## 2017-07-24 DIAGNOSIS — F419 Anxiety disorder, unspecified: Secondary | ICD-10-CM | POA: Diagnosis not present

## 2017-07-24 DIAGNOSIS — F03B18 Unspecified dementia, moderate, with other behavioral disturbance: Secondary | ICD-10-CM

## 2017-07-24 MED ORDER — ESCITALOPRAM OXALATE 5 MG PO TABS: ORAL_TABLET | ORAL | 0 refills | Status: DC

## 2017-07-24 MED ORDER — ESCITALOPRAM OXALATE 5 MG PO TABS
ORAL_TABLET | ORAL | 6 refills | Status: DC
Start: 2017-07-24 — End: 2017-11-07

## 2017-07-24 NOTE — Progress Notes (Signed)
NEUROLOGY FOLLOW UP OFFICE NOTE  Dustin [name] [id number]  HISTORY OF PRESENT ILLNESS: I had the pleasure of seeing Dustin [name] in follow-up in the neurology clinic on 07/24/2017.  The patient was last seen a year ago for worsening memory. MMSE in October 2017 was 23/30 (26/30 in January 2017). His wife called our office reporting worsening and wrote a 3-page letter which I reviewed prior to the visit. She reports the Aricept made him mean, throwing everything, fussing, hollering, screaming at her, saying he wanted a divorce and said he was going to get rid of her. He drove away and she could not find him for hours. He refused to take his other medications, throwing them at her, so she stopped it. He told his regular doctor he was going to kill her. He got better after that, now he does forget a lot and has not driven in 8 months. He cannot operate the remote control anymore, getting irritated at commercials and changing the channel, but unable to return to it later. She has to find it for him all day. He does not recall the day before or what they had for breakfast earlier. She reports some days he is sweet, some days he is not. He goes to church and the doctor mostly, or his son tells him to go somewhere with him. She dresses him, fixes his meals, takes him up and down steps so he won't fall. He asked her what side of the bed he sleeps on which he had been sleeping on for 25 years. His wife expressed concern about his son trying to talk him into giving away what they had worked for together. She reports his son does not think there is anything wrong because he can talk hunting and fishing tales from years back. She reports his son persuades him a lot. He does what his son says. She is worried that his son is trying to talk him into giving away what they have worked for in the last 25 years, and is asking for documentation that she should be with him with any legal discussions.    Dustin [name] agrees  to having his wife present with any legal discussions. He reports memory is bad. He reports that he relies on his wife a lot, his wife administers medications. He has been sensitive to medication trials in the past, he had side effects on Paxil. He has positional dizziness, he had to give up hunting because he would get dizzy when he lifts his head up, winding up on the floor. He has to be cautious with certain ways he moves his body. His wife reports his movements have slowed down. He has occasional headaches. He made the decision to stop driving, he just handed his wife the keys, stating he could not focus good. He fell out of bed 4 months ago, no REM behavior disorder. He has been more fidgety with no patience. His wife has always been in charge of bill payments.   HPI 11/10/2015: This is a pleasant 71 yo LH man with a history of hypertension, hyperlipidemia, diabetes, who presented with worsening memory. His wife started noticing changes around summertime 2016, after getting home from a barbeque, he wanted to eat. She said she would heat up the bbq then he started cussing and saying he did not like bbq. His wife reminded him that he ate it earlier, and he did eat it later that day. His wife noticed he was wobbly and sat  him down, then he could not get out of the chair and was confused. He did not seek medical attention, he was back to baseline the next day with no recollection of the event. Since then, his wife has noticed that if she says or does something he does not understand, he would get very upset with her. He states he does not remember this, his wife reports "he thinks I am fussing at him." She has seen him standing in the hall looking like he does not know where to go. He occasionally has a distant look and would not respond or would ask her "what's wrong?" They frequently go to the beach and he loves to fish, but one time their alarm system went down and he refused to leave because he was so worried  someone would come in their house. When his wife does the laundry, he would not leave the washing machine because he would be afraid a pipe might burst. He himself reports that "other than just forgetting, memory is pretty good." He puts something down and forgets where he put it. He denies getting lost driving, no missed medications, word-finding difficulties. His wife is in charge of bills. 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 His father had Alzheimer's disease. No history of head injuries or alcohol use. His wife has noticed he drives slowly that she does most of the driving. Over the past couple of years, she noticed he would stop at a green light or go on even with a red light.   PAST MEDICAL HISTORY: Past Medical History:  Diagnosis Date  . Abdominal hernia   . Coronary atherosclerosis of native coronary artery 2003   a. NSTEMI s/p BMS to distal circumflex and PDA 2003. b. Cath 2007: patent stents with moderate mid to distal LAD disease.  . Diabetes mellitus, type 2 ([place])   . Diastolic dysfunction    Grade 2. EF 55-60%  . Diverticulosis   . Essential hypertension, benign   . GERD (gastroesophageal reflux disease)   . Hyperlipidemia   . Irritable bowel syndrome   . Nephrolithiasis   . NSTEMI (non-ST elevated myocardial infarction) [facility])     MEDICATIONS: Current Outpatient Prescriptions on File Prior to Visit  Medication Sig Dispense Refill  . amLODipine (NORVASC) 5 MG tablet Take 5 mg by mouth daily.    Marland Kitchen aspirin EC 81 MG tablet Take 81 mg by mouth daily.    . fenofibrate (TRICOR) 145 MG tablet Take 1 tablet by mouth daily.    . fish oil-omega-3 fatty acids 1000 MG capsule Take 2 g by mouth 3 (three) times daily.     Marland Kitchen HYDROcodone-acetaminophen (NORCO/VICODIN) 5-325 MG tablet Take 1 tablet by mouth every 6 (six) hours as needed.   0  . metFORMIN (GLUCOPHAGE)  500 MG tablet Take 2 tablets by mouth 2 (two) times daily.    Marland Kitchen atorvastatin (LIPITOR) 40 MG tablet Take 40 mg by mouth daily.    Marland Kitchen donepezil (ARICEPT) 10 MG tablet Take 1/2 tablet daily for 1 month, then increase to 1 tablet daily (Patient not taking: Reported on 07/24/2017) 30 tablet 11  . glipiZIDE (GLUCOTROL) 10 MG tablet Take 1 tablet by mouth daily.    Marland Kitchen lisinopril-hydrochlorothiazide (PRINZIDE,ZESTORETIC) 20-12.5 MG per tablet Take 1 tablet by mouth daily.    . nitroGLYCERIN (NITROSTAT) 0.4 MG SL tablet Place 1  tablet (0.4 mg total) under the tongue every 5 (five) minutes as needed for chest pain. (Patient not taking: Reported on 07/24/2017) 25 tablet 3  . PROAIR RESPICLICK 299 (90 Base) MCG/ACT AEPB 2 puffs once daily  0  . zolpidem (AMBIEN) 5 MG tablet take 1 tablet by mouth at bedtime for sleep as needed  0   No current facility-administered medications on file prior to visit.     ALLERGIES: Allergies  Allergen Reactions  . Morphine And Related Nausea And Vomiting  . Ziac [Bisoprolol-Hydrochlorothiazide]     Unknown reaction    FAMILY HISTORY: Family History  Problem Relation Age of Onset  . Heart attack Mother   . Hyperlipidemia Mother   . Diabetes Mother   . Heart attack Father   . Hyperlipidemia Father   . Heart attack Sister   . Hyperlipidemia Sister     SOCIAL HISTORY: Social History   Social History  . Marital status: Married    Spouse name: N/A  . Number of children: 4  . Years of education: N/A   Occupational History  . [employer]   Social History Main Topics  . Smoking status: Never Smoker  . Smokeless tobacco: Never Used  . Alcohol use No  . Drug use: No  . Sexual activity: Not on file   Other Topics Concern  . Not on file   Social History Narrative  . No narrative on file    REVIEW OF SYSTEMS: Constitutional: No fevers, chills, or sweats, no generalized fatigue, change in appetite Eyes: No visual changes, double  vision, eye pain Ear, nose and throat: No hearing loss, ear pain, nasal congestion, sore throat Cardiovascular: No chest pain, palpitations Respiratory:  No shortness of breath at rest or with exertion, wheezes GastrointestinaI: No nausea, vomiting, diarrhea, abdominal pain, fecal incontinence Genitourinary:  No dysuria, urinary retention or frequency Musculoskeletal:  No neck pain, back pain Integumentary: No rash, pruritus, skin lesions Neurological: as above Psychiatric: No depression, insomnia, anxiety Endocrine: No palpitations, fatigue, diaphoresis, mood swings, change in appetite, change in weight, increased thirst Hematologic/Lymphatic:  No anemia, purpura, petechiae. Allergic/Immunologic: no itchy/runny eyes, nasal congestion, recent allergic reactions, rashes  PHYSICAL EXAM: Vitals:   07/24/17 1110  BP: 130/76  Pulse: 74  SpO2: 97%   General: No acute distress Head:  Normocephalic/atraumatic Neck: supple, no paraspinal tenderness, full range of motion Heart:  Regular rate and rhythm Lungs:  Clear to auscultation bilaterally Back: No paraspinal tenderness Skin/Extremities: No rash, no edema Neurological Exam: alert and oriented to person, place, and day of week/season. No aphasia or dysarthria. Fund of knowledge is reduced Recent and remote memory are impaired.  Attention and concentration are reduced, unable to spell WORLD backward.    Able to name objects and repeat phrases. CDT 3/5 MMSE - Mini Mental State Exam 07/24/2017 08/01/2016 11/10/2015  Orientation to time 2 3 3   Orientation to Place 5 5 5   Registration 3 3 3   Attention/ Calculation 0 3 5  Recall 1 0 2  Language- name 2 objects 2 2 2   Language- repeat 1 1 1   Language- follow 3 step command 3 3 2   Language- read & follow direction 1 1 1   Write a sentence 0 1 1  Copy design 0 1 1  Total score 18 23 26     Cranial nerves: Pupils equal, round, reactive to light.  Extraocular movements intact with no nystagmus.  Visual fields full. Facial sensation intact. No facial asymmetry. Tongue,  uvula, palate midline.  Motor: Bulk normal, mild bilateral cogwheeling, muscle strength 5/5 throughout with no pronator drift.  Sensation to light touch intact.  No extinction to double simultaneous stimulation.  Deep tendon reflexes 2+ throughout, toes downgoing.  Finger to nose testing intact.  Gait slow and cautious, unsteady with decreased arm swing bilaterally, arm appear stiff by his sides, unable to tandem walk, +postural instability. No tremors, good finger taps.  IMPRESSION: This is a pleasant 71 yo LH man with vascular risk factors including hypertension, hyperlipidemia, diabetes, with worsening memory loss. There is notable decline both cognitively and physically since his visit a year ago. His MMSE today is 18/30 (23/30 in October 2017). 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 He had side effects on Aricept. He was started on Paxil on last visit for mood changes, but had side effects as well. He continues to have behavioral changes due to mild to moderate dementia, his wife is agreeable to starting low dose Lexapro 2.5mg  daily. Side effects were discussed, if he tolerates this, we will plan to further uptitrate as needed. He will be referred to geriatric psychiatry for anxiety. His wife is his power of attorney, she has been in charge of finances and medications since he is unable to manage them independently. He agrees to have her present at all times for any legal decision-making. He does not drive. Continue 24/7 care. He will follow-up in 6 months and knows to call for any changes.  Thank you for allowing me to participate in his care.  Please do not hesitate to call for any questions or concerns.  The duration of this appointment visit was 25 minutes of face-to-face time with the patient.  Greater than 50% of this time was spent in counseling, explanation of diagnosis, planning of further  management, and coordination of care.   Ellouise Newer, M.D.   CC: Dr. Gerarda Fraction

## 2017-07-24 NOTE — Patient Instructions (Addendum)
1. Start Lexapro 5mg : Take 1/2 tablet daily for a month, then increase to 1 tablet daily 2. Refer to Geriatric Psychiatrist Dr. Casimiro Needle. We have sent the referral. You can call [phone] to schedule an appointment.  3. Continue all your medications 4. Continue 24/7 care 5. Follow-up in 6 months, call for any changes  FALL PRECAUTIONS: Be cautious when walking. Scan the area for obstacles that may increase the risk of trips and falls. When getting up in the mornings, sit up at the edge of the bed for a few minutes before getting out of bed. Consider elevating the bed at the head end to avoid drop of blood pressure when getting up. Walk always in a well-lit room (use night lights in the walls). Avoid area rugs or power cords from appliances in the middle of the walkways. Use a walker or a cane if necessary and consider physical therapy for balance exercise. Get your eyesight checked regularly.  FINANCIAL OVERSIGHT: Supervision, especially oversight when making financial decisions or transactions is also recommended.  HOME SAFETY: Consider the safety of the kitchen when operating appliances like stoves, microwave oven, and blender. Consider having supervision and share cooking responsibilities until no longer able to participate in those. Accidents with firearms and other hazards in the house should be identified and addressed as well.  DRIVING: Regarding driving, in patients with progressive memory problems, driving will be impaired. We advise to have someone else do the driving if trouble finding directions or if minor accidents are reported. Independent driving assessment is available to determine safety of driving.  ABILITY TO BE LEFT ALONE: If patient is unable to contact 911 operator, consider using LifeLine, or when the need is there, arrange for someone to stay with patients. Smoking is a fire hazard, consider supervision or cessation. Risk of wandering should be assessed by caregiver and if  detected at any point, supervision and safe proof recommendations should be instituted.  MEDICATION SUPERVISION: Inability to self-administer medication needs to be constantly addressed. Implement a mechanism to ensure safe administration of the medications.  RECOMMENDATIONS FOR ALL PATIENTS WITH MEMORY PROBLEMS: 1. Continue to exercise (Recommend 30 minutes of walking everyday, or 3 hours every week) 2. Increase social interactions - continue going to [place] and enjoy social gatherings with friends and family 3. Eat healthy, avoid fried foods and eat more fruits and vegetables 4. Maintain adequate blood pressure, blood sugar, and blood cholesterol level. Reducing the risk of stroke and cardiovascular disease also helps promoting better memory. 5. Avoid stressful situations. Live a simple life and avoid aggravations. Organize your time and prepare for the next day in anticipation. 6. Sleep well, avoid any interruptions of sleep and avoid any distractions in the bedroom that may interfere with adequate sleep quality 7. Avoid sugar, avoid sweets as there is a strong link between excessive sugar intake, diabetes, and cognitive impairment We discussed the Mediterranean diet, which has been shown to help patients reduce the risk of progressive memory disorders and reduces cardiovascular risk. This includes eating fish, eat fruits and green leafy vegetables, nuts like almonds and hazelnuts, walnuts, and also use olive oil. Avoid fast foods and fried foods as much as possible. Avoid sweets and sugar as sugar use has been linked to worsening of memory function.  There is always a concern of gradual progression of memory problems. If this is the case, then we may need to adjust level of care according to patient needs. Support, both to the patient and caregiver, should  then be put into place.

## 2017-07-29 ENCOUNTER — Encounter: Payer: Self-pay | Admitting: Neurology

## 2017-07-31 ENCOUNTER — Telehealth: Payer: Self-pay | Admitting: Neurology

## 2017-07-31 NOTE — Telephone Encounter (Signed)
Pt's spouse called and said the medication he is taking is making not eat and also was asking about a letter Dr Delice Lesch was writing for him, please call and advise

## 2017-07-31 NOTE — Telephone Encounter (Signed)
Spoke with pt's wife relaying message below.  She was agreeable and will call with an update in the next week or 2

## 2017-07-31 NOTE — Telephone Encounter (Signed)
Returned call.  Spoke with pt's wife, Posey Pronto.  Let her know that I sent the requested letter out on Monday via [employer] and she should receive it soon.  She states that pt is not eating well with the addition of Lexapro.  She gives it to him at [place] - he sleeps well now, but has no appetite.  She says he may take 2 or 3 bites of food and feel full.  She is starting to worry about this since he has "always been a big eater".

## 2017-07-31 NOTE — Telephone Encounter (Signed)
I would give it a little more time for his body to get used to, she can try giving it at bedtime and see if any difference. Thanks

## 2017-09-09 DIAGNOSIS — D225 Melanocytic nevi of trunk: Secondary | ICD-10-CM | POA: Diagnosis not present

## 2017-09-09 DIAGNOSIS — C44229 Squamous cell carcinoma of skin of left ear and external auricular canal: Secondary | ICD-10-CM | POA: Diagnosis not present

## 2017-10-04 DIAGNOSIS — E663 Overweight: Secondary | ICD-10-CM | POA: Diagnosis not present

## 2017-10-04 DIAGNOSIS — M1991 Primary osteoarthritis, unspecified site: Secondary | ICD-10-CM | POA: Diagnosis not present

## 2017-10-04 DIAGNOSIS — E119 Type 2 diabetes mellitus without complications: Secondary | ICD-10-CM | POA: Diagnosis not present

## 2017-10-04 DIAGNOSIS — I10 Essential (primary) hypertension: Secondary | ICD-10-CM | POA: Diagnosis not present

## 2017-10-04 DIAGNOSIS — Z6825 Body mass index (BMI) 25.0-25.9, adult: Secondary | ICD-10-CM | POA: Diagnosis not present

## 2017-10-25 ENCOUNTER — Telehealth: Payer: Self-pay | Admitting: Neurology

## 2017-10-25 DIAGNOSIS — R351 Nocturia: Secondary | ICD-10-CM | POA: Diagnosis not present

## 2017-10-25 NOTE — Telephone Encounter (Signed)
Pt's wife left a message saying pt needs a medication change

## 2017-10-29 ENCOUNTER — Telehealth: Payer: Self-pay | Admitting: Neurology

## 2017-10-29 NOTE — Telephone Encounter (Signed)
Pt's spouse called in regards to changing pt's medication from 5mg 's to 10mg 's he does so much better on the 10 but could not remember the name of the medication, will be back at home this afternoon for a call back

## 2017-11-04 DIAGNOSIS — Z85828 Personal history of other malignant neoplasm of skin: Secondary | ICD-10-CM | POA: Diagnosis not present

## 2017-11-04 DIAGNOSIS — Z08 Encounter for follow-up examination after completed treatment for malignant neoplasm: Secondary | ICD-10-CM | POA: Diagnosis not present

## 2017-11-07 ENCOUNTER — Telehealth: Payer: Self-pay | Admitting: Neurology

## 2017-11-07 DIAGNOSIS — F419 Anxiety disorder, unspecified: Secondary | ICD-10-CM

## 2017-11-07 MED ORDER — ESCITALOPRAM OXALATE 10 MG PO TABS
ORAL_TABLET | ORAL | 6 refills | Status: DC
Start: 2017-11-07 — End: 2018-01-21

## 2017-11-07 NOTE — Telephone Encounter (Signed)
Rx sent to pharmacy   

## 2017-11-07 NOTE — Telephone Encounter (Signed)
Patient's wife called and is needing to have his Lexapro medication changed from 5 MG to 10 mg. She said he does better on the 10MG  and taking it in the Morning. He uses [facility] in [place]. Thanks

## 2018-01-02 DIAGNOSIS — Z08 Encounter for follow-up examination after completed treatment for malignant neoplasm: Secondary | ICD-10-CM | POA: Diagnosis not present

## 2018-01-02 DIAGNOSIS — Z85828 Personal history of other malignant neoplasm of skin: Secondary | ICD-10-CM | POA: Diagnosis not present

## 2018-01-02 DIAGNOSIS — X32XXXA Exposure to sunlight, initial encounter: Secondary | ICD-10-CM | POA: Diagnosis not present

## 2018-01-02 DIAGNOSIS — L570 Actinic keratosis: Secondary | ICD-10-CM | POA: Diagnosis not present

## 2018-01-06 DIAGNOSIS — G894 Chronic pain syndrome: Secondary | ICD-10-CM | POA: Diagnosis not present

## 2018-01-06 DIAGNOSIS — M1991 Primary osteoarthritis, unspecified site: Secondary | ICD-10-CM | POA: Diagnosis not present

## 2018-01-06 DIAGNOSIS — R2681 Unsteadiness on feet: Secondary | ICD-10-CM | POA: Diagnosis not present

## 2018-01-06 DIAGNOSIS — F0390 Unspecified dementia without behavioral disturbance: Secondary | ICD-10-CM | POA: Diagnosis not present

## 2018-01-06 DIAGNOSIS — I10 Essential (primary) hypertension: Secondary | ICD-10-CM | POA: Diagnosis not present

## 2018-01-06 DIAGNOSIS — E663 Overweight: Secondary | ICD-10-CM | POA: Diagnosis not present

## 2018-01-06 DIAGNOSIS — E1142 Type 2 diabetes mellitus with diabetic polyneuropathy: Secondary | ICD-10-CM | POA: Diagnosis not present

## 2018-01-06 DIAGNOSIS — Z125 Encounter for screening for malignant neoplasm of prostate: Secondary | ICD-10-CM | POA: Diagnosis not present

## 2018-01-06 DIAGNOSIS — R946 Abnormal results of thyroid function studies: Secondary | ICD-10-CM | POA: Diagnosis not present

## 2018-01-06 DIAGNOSIS — Z6825 Body mass index (BMI) 25.0-25.9, adult: Secondary | ICD-10-CM | POA: Diagnosis not present

## 2018-01-06 DIAGNOSIS — E119 Type 2 diabetes mellitus without complications: Secondary | ICD-10-CM | POA: Diagnosis not present

## 2018-01-06 DIAGNOSIS — G20 Parkinson's disease: Secondary | ICD-10-CM | POA: Diagnosis not present

## 2018-01-21 ENCOUNTER — Other Ambulatory Visit: Payer: Self-pay

## 2018-01-21 ENCOUNTER — Ambulatory Visit (INDEPENDENT_AMBULATORY_CARE_PROVIDER_SITE_OTHER): Payer: Medicare Other | Admitting: Neurology

## 2018-01-21 ENCOUNTER — Encounter: Payer: Self-pay | Admitting: Neurology

## 2018-01-21 VITALS — BP 126/88 | HR 66 | Ht 68.0 in | Wt 172.0 lb

## 2018-01-21 DIAGNOSIS — F419 Anxiety disorder, unspecified: Secondary | ICD-10-CM | POA: Diagnosis not present

## 2018-01-21 DIAGNOSIS — G20 Parkinson's disease: Secondary | ICD-10-CM | POA: Diagnosis not present

## 2018-01-21 DIAGNOSIS — F0391 Unspecified dementia with behavioral disturbance: Secondary | ICD-10-CM | POA: Diagnosis not present

## 2018-01-21 DIAGNOSIS — F03B18 Unspecified dementia, moderate, with other behavioral disturbance: Secondary | ICD-10-CM

## 2018-01-21 MED ORDER — ESCITALOPRAM OXALATE 10 MG PO TABS: ORAL_TABLET | ORAL | 6 refills | Status: DC

## 2018-01-21 MED ORDER — MELATONIN 3 MG PO CAPS: ORAL_CAPSULE | ORAL | 11 refills | Status: DC

## 2018-01-21 NOTE — Patient Instructions (Addendum)
1. Increase Lexapro to 20mg  daily 2. Try melatonin 6mg  every night for the jerks at night 3. Use walker at all times 4. Recommend getting more help at home and looking into day programs 5. Follow-up in 6 months, call for any changes  [facility] [phone]  [facility] [address] [phone]  [facility] [address] [phone]

## 2018-01-21 NOTE — Progress Notes (Signed)
NEUROLOGY FOLLOW UP OFFICE NOTE  Dustin [name] [id number]  DOB: [birth year]  HISTORY OF PRESENT ILLNESS: I had the pleasure of seeing Dustin [name] in follow-up in the neurology clinic on 01/21/2018.  The patient was last seen 6 months ago for moderate dementia with behavioral disturbance. MMSE in October 2018 was 18/30 (23/30 in October 2017, 26/30 in January 2017). He is again accompanied by his wife who helps supplement the history today. She brings a letter detailing his symptoms. She has to help him get in the shower and shower and dry himself off, as well as put on deodorant and powder. She has to dress him and put on his belt, shoes, socks, and coat, comb his hair. She makes all his meals, puts salt/pepper and butter. He holds on to her when walking. She has to make sure he has his wallet at all times. He gets worse after 3pm, he cannot remember where the bathroom, bedroom, or kitchen are. He asks if they have a bed and where they are going to sleep. He cannot turn the TV off. He fell out of bed twice from significant jerking in his sleep. He is so fidgety. His wife reports sometimes he punches out in his sleep. He wants everything right now, no patience. He can't wait for anything most of the time. His appetite is "too good," he likes potatoes, which is not good for his diabetes. He reports his memory is poor. He denies any headaches, dizziness, vision changes. He needs his cane to stand from a chair.   HPI 11/10/2015: This is a pleasant 72 yo LH man with a history of hypertension, hyperlipidemia, diabetes, who presented with worsening memory. His wife started noticing changes around summertime 2016, after getting home from a barbeque, he wanted to eat. She said she would heat up the bbq then he started cussing and saying he did not like bbq. His wife reminded him that he ate it earlier, and he did eat it later that day. His wife noticed he was wobbly and sat him down, then he could not get out  of the chair and was confused. He did not seek medical attention, he was back to baseline the next day with no recollection of the event. Since then, his wife has noticed that if she says or does something he does not understand, he would get very upset with her. He states he does not remember this, his wife reports "he thinks I am fussing at him." She has seen him standing in the hall looking like he does not know where to go. He occasionally has a distant look and would not respond or would ask her "what's wrong?" They frequently go to the beach and he loves to fish, but one time their alarm system went down and he refused to leave because he was so worried someone would come in their house. When his wife does the laundry, he would not leave the washing machine because he would be afraid a pipe might burst. He himself reports that "other than just forgetting, memory is pretty good." He puts something down and forgets where he put it. He denies getting lost driving, no missed medications, word-finding difficulties. His wife is in charge of bills. 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 His father had Alzheimer's disease. No history of head injuries or alcohol use. His  wife has noticed he drives slowly that she does most of the driving. Over the past couple of years, she noticed he would stop at a green light or go on even with a red light.   PAST MEDICAL HISTORY: Past Medical History:  Diagnosis Date  . Abdominal hernia   . Coronary atherosclerosis of native coronary artery 2003   a. NSTEMI s/p BMS to distal circumflex and PDA 2003. b. Cath 2007: patent stents with moderate mid to distal LAD disease.  . Diabetes mellitus, type 2 ([place])   . Diastolic dysfunction    Grade 2. EF 55-60%  . Diverticulosis   . Essential hypertension, benign   . GERD (gastroesophageal reflux disease)   .  Hyperlipidemia   . Irritable bowel syndrome   . Nephrolithiasis   . NSTEMI (non-ST elevated myocardial infarction) [facility])     MEDICATIONS: Current Outpatient Medications on File Prior to Visit  Medication Sig Dispense Refill  . amLODipine (NORVASC) 5 MG tablet Take 5 mg by mouth daily.    Marland Kitchen aspirin EC 81 MG tablet Take 81 mg by mouth daily.    Marland Kitchen atorvastatin (LIPITOR) 40 MG tablet Take 40 mg by mouth daily.    Marland Kitchen escitalopram (LEXAPRO) 10 MG tablet Take 1 tablet every day 60 tablet 6  . fenofibrate (TRICOR) 145 MG tablet Take 1 tablet by mouth daily.    . fish oil-omega-3 fatty acids 1000 MG capsule Take 2 g by mouth 3 (three) times daily.     Marland Kitchen glipiZIDE (GLUCOTROL) 10 MG tablet Take 1 tablet by mouth daily.    Marland Kitchen HYDROcodone-acetaminophen (NORCO/VICODIN) 5-325 MG tablet Take 1 tablet by mouth every 6 (six) hours as needed.   0  . metFORMIN (GLUCOPHAGE) 500 MG tablet Take 2 tablets by mouth 2 (two) times daily.    . nitroGLYCERIN (NITROSTAT) 0.4 MG SL tablet Place 1 tablet (0.4 mg total) under the tongue every 5 (five) minutes as needed for chest pain. 25 tablet 3  . PROAIR RESPICLICK 660 (90 Base) MCG/ACT AEPB 2 puffs once daily  0  . zolpidem (AMBIEN) 5 MG tablet take 1 tablet by mouth at bedtime for sleep as needed  0  . lisinopril (PRINIVIL,ZESTRIL) 2.5 MG tablet      No current facility-administered medications on file prior to visit.     ALLERGIES: Allergies  Allergen Reactions  . Morphine And Related Nausea And Vomiting  . Ziac [Bisoprolol-Hydrochlorothiazide]     Unknown reaction    FAMILY HISTORY: Family History  Problem Relation Age of Onset  . Heart attack Mother   . Hyperlipidemia Mother   . Diabetes Mother   . Heart attack Father   . Hyperlipidemia Father   . Heart attack Sister   . Hyperlipidemia Sister     SOCIAL HISTORY: Social History   Socioeconomic History  . Marital status: Married    Spouse name: Not on file  . Number of children: 4  . Years of  education: Not on file  . Highest education level: Not on file  Occupational History  . Occupation: IT sales professional: [employer]  . Financial resource strain: Not on file  . Food insecurity:    Worry: Not on file    Inability: Not on file  . Transportation needs:    Medical: Not on file    Non-medical: Not on file  Tobacco Use  . Smoking status: Never Smoker  . Smokeless tobacco: Never Used  Substance  and Sexual Activity  . Alcohol use: No    Alcohol/week: 0.0 oz  . Drug use: No  . Sexual activity: Not on file  Lifestyle  . Physical activity:    Days per week: Not on file    Minutes per session: Not on file  . Stress: Not on file  Relationships  . Social connections:    Talks on phone: Not on file    Gets together: Not on file    Attends religious service: Not on file    Active member of club or organization: Not on file    Attends meetings of clubs or organizations: Not on file    Relationship status: Not on file  . Intimate partner violence:    Fear of current or ex partner: Not on file    Emotionally abused: Not on file    Physically abused: Not on file    Forced sexual activity: Not on file  Other Topics Concern  . Not on file  Social History Narrative  . Not on file    REVIEW OF SYSTEMS: Constitutional: No fevers, chills, or sweats, no generalized fatigue, change in appetite Eyes: No visual changes, double vision, eye pain Ear, nose and throat: No hearing loss, ear pain, nasal congestion, sore throat Cardiovascular: No chest pain, palpitations Respiratory:  No shortness of breath at rest or with exertion, wheezes GastrointestinaI: No nausea, vomiting, diarrhea, abdominal pain, fecal incontinence Genitourinary:  No dysuria, urinary retention or frequency Musculoskeletal:  No neck pain, back pain Integumentary: No rash, pruritus, skin lesions Neurological: as above Psychiatric: No depression, insomnia, anxiety Endocrine:  No palpitations, fatigue, diaphoresis, mood swings, change in appetite, change in weight, increased thirst Hematologic/Lymphatic:  No anemia, purpura, petechiae. Allergic/Immunologic: no itchy/runny eyes, nasal congestion, recent allergic reactions, rashes  PHYSICAL EXAM: Vitals:   01/21/18 1048  BP: 126/88  Pulse: 66  SpO2: 97%   General: No acute distress, flat affect Head:  Normocephalic/atraumatic Neck: supple, no paraspinal tenderness, full range of motion Heart:  Regular rate and rhythm Lungs:  Clear to auscultation bilaterally Back: No paraspinal tenderness Skin/Extremities: No rash, no edema Neurological Exam: alert and oriented to person, place, and month/season. No aphasia or dysarthria. Fund of knowledge is reduced Recent and remote memory are impaired.  Attention and concentration are reduced, unable to spell WORLD backward.    Able to name objects and repeat phrases. CDT 3/5 MMSE - Mini Mental State Exam 01/21/2018 07/24/2017 08/01/2016  Orientation to time 2 2 3   Orientation to Place 4 5 5   Registration 3 3 3   Attention/ Calculation 0 0 3  Recall 0 1 0  Language- name 2 objects 2 2 2   Language- repeat 1 1 1   Language- follow 3 step command 2 3 3   Language- read & follow direction 1 1 1   Write a sentence 0 0 1  Copy design 0 0 1  Total score 15 18 23     Cranial nerves: Pupils equal, round, reactive to light.  Extraocular movements intact with no nystagmus. Visual fields full. Facial sensation intact. No facial asymmetry. Tongue, uvula, palate midline.  Motor: Bulk normal, mild bilateral cogwheeling L>R, muscle strength 5/5 throughout with no pronator drift.  Sensation to light touch intact.  No extinction to double simultaneous stimulation.  Deep tendon reflexes 2+ throughout, toes downgoing.  Finger to nose testing intact.  Gait slow and cautious, unsteady with cane, +postural instability. There is a mild left thumb resting tremor, no postural tremor, +bilateral endpoint  tremor, good finger taps.  IMPRESSION: This is a pleasant 72 yo LH man with vascular risk factors including hypertension, hyperlipidemia, diabetes, with moderate dementia with behavioral disturbance. His MMSE toda is 15/30 (18/30 in October 2018, 23/30 in October 2017). He is also again noted to have some signs of parkinsonism, with worsening gait. He had side effects on Aricept, his wife is very hesitant to start any other medications for dementia. She had agreed to start Lexapro on his last visit and felt it helped some, increase to 20mg  daily. 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 We discussed sundowning and techniques to do at home to help with symptoms. We discussed that he does have some features of parkinsonism, and we can try medication to help with symptoms, but that these are not curative as well. His wife is again hesitant to start any medication with potential side effects. We discussed needing more help at home, his wife declines assisted living at this time, resources for home health and adult day programs were given today. He was advised to use a walker at all times. He does not drive. Continue 24/7 care. He will follow-up in 6 months and knows to call for any changes.  Thank you for allowing me to participate in his care.  Please do not hesitate to call for any questions or concerns.  The duration of this appointment visit was 25 minutes of face-to-face time with the patient.  Greater than 50% of this time was spent in counseling, explanation of diagnosis, planning of further management, and coordination of care.   Ellouise Newer, M.D.   CC: Dr. Gerarda Fraction

## 2018-01-27 DIAGNOSIS — G301 Alzheimer's disease with late onset: Secondary | ICD-10-CM | POA: Diagnosis not present

## 2018-01-27 DIAGNOSIS — G219 Secondary parkinsonism, unspecified: Secondary | ICD-10-CM | POA: Diagnosis not present

## 2018-01-27 DIAGNOSIS — I10 Essential (primary) hypertension: Secondary | ICD-10-CM | POA: Diagnosis not present

## 2018-01-27 DIAGNOSIS — E1140 Type 2 diabetes mellitus with diabetic neuropathy, unspecified: Secondary | ICD-10-CM | POA: Diagnosis not present

## 2018-01-28 DIAGNOSIS — R2681 Unsteadiness on feet: Secondary | ICD-10-CM | POA: Diagnosis not present

## 2018-01-28 DIAGNOSIS — M6281 Muscle weakness (generalized): Secondary | ICD-10-CM | POA: Diagnosis not present

## 2018-01-28 DIAGNOSIS — I10 Essential (primary) hypertension: Secondary | ICD-10-CM | POA: Diagnosis not present

## 2018-01-28 DIAGNOSIS — R41841 Cognitive communication deficit: Secondary | ICD-10-CM | POA: Diagnosis not present

## 2018-01-28 DIAGNOSIS — F0390 Unspecified dementia without behavioral disturbance: Secondary | ICD-10-CM | POA: Diagnosis not present

## 2018-01-28 DIAGNOSIS — E1142 Type 2 diabetes mellitus with diabetic polyneuropathy: Secondary | ICD-10-CM | POA: Diagnosis not present

## 2018-01-31 DIAGNOSIS — F0390 Unspecified dementia without behavioral disturbance: Secondary | ICD-10-CM | POA: Diagnosis not present

## 2018-01-31 DIAGNOSIS — I10 Essential (primary) hypertension: Secondary | ICD-10-CM | POA: Diagnosis not present

## 2018-01-31 DIAGNOSIS — E1142 Type 2 diabetes mellitus with diabetic polyneuropathy: Secondary | ICD-10-CM | POA: Diagnosis not present

## 2018-01-31 DIAGNOSIS — R2681 Unsteadiness on feet: Secondary | ICD-10-CM | POA: Diagnosis not present

## 2018-01-31 DIAGNOSIS — R41841 Cognitive communication deficit: Secondary | ICD-10-CM | POA: Diagnosis not present

## 2018-01-31 DIAGNOSIS — M6281 Muscle weakness (generalized): Secondary | ICD-10-CM | POA: Diagnosis not present

## 2018-02-03 DIAGNOSIS — F0390 Unspecified dementia without behavioral disturbance: Secondary | ICD-10-CM | POA: Diagnosis not present

## 2018-02-03 DIAGNOSIS — E1142 Type 2 diabetes mellitus with diabetic polyneuropathy: Secondary | ICD-10-CM | POA: Diagnosis not present

## 2018-02-03 DIAGNOSIS — R2681 Unsteadiness on feet: Secondary | ICD-10-CM | POA: Diagnosis not present

## 2018-02-03 DIAGNOSIS — I10 Essential (primary) hypertension: Secondary | ICD-10-CM | POA: Diagnosis not present

## 2018-02-03 DIAGNOSIS — R41841 Cognitive communication deficit: Secondary | ICD-10-CM | POA: Diagnosis not present

## 2018-02-03 DIAGNOSIS — M6281 Muscle weakness (generalized): Secondary | ICD-10-CM | POA: Diagnosis not present

## 2018-02-04 DIAGNOSIS — E1142 Type 2 diabetes mellitus with diabetic polyneuropathy: Secondary | ICD-10-CM | POA: Diagnosis not present

## 2018-02-04 DIAGNOSIS — F0390 Unspecified dementia without behavioral disturbance: Secondary | ICD-10-CM | POA: Diagnosis not present

## 2018-02-04 DIAGNOSIS — I10 Essential (primary) hypertension: Secondary | ICD-10-CM | POA: Diagnosis not present

## 2018-02-04 DIAGNOSIS — M6281 Muscle weakness (generalized): Secondary | ICD-10-CM | POA: Diagnosis not present

## 2018-02-04 DIAGNOSIS — R2681 Unsteadiness on feet: Secondary | ICD-10-CM | POA: Diagnosis not present

## 2018-02-04 DIAGNOSIS — R41841 Cognitive communication deficit: Secondary | ICD-10-CM | POA: Diagnosis not present

## 2018-02-06 DIAGNOSIS — R41841 Cognitive communication deficit: Secondary | ICD-10-CM | POA: Diagnosis not present

## 2018-02-06 DIAGNOSIS — I10 Essential (primary) hypertension: Secondary | ICD-10-CM | POA: Diagnosis not present

## 2018-02-06 DIAGNOSIS — M6281 Muscle weakness (generalized): Secondary | ICD-10-CM | POA: Diagnosis not present

## 2018-02-06 DIAGNOSIS — R2681 Unsteadiness on feet: Secondary | ICD-10-CM | POA: Diagnosis not present

## 2018-02-06 DIAGNOSIS — E1142 Type 2 diabetes mellitus with diabetic polyneuropathy: Secondary | ICD-10-CM | POA: Diagnosis not present

## 2018-02-06 DIAGNOSIS — F0390 Unspecified dementia without behavioral disturbance: Secondary | ICD-10-CM | POA: Diagnosis not present

## 2018-02-07 DIAGNOSIS — M6281 Muscle weakness (generalized): Secondary | ICD-10-CM | POA: Diagnosis not present

## 2018-02-07 DIAGNOSIS — E1142 Type 2 diabetes mellitus with diabetic polyneuropathy: Secondary | ICD-10-CM | POA: Diagnosis not present

## 2018-02-07 DIAGNOSIS — I10 Essential (primary) hypertension: Secondary | ICD-10-CM | POA: Diagnosis not present

## 2018-02-07 DIAGNOSIS — R2681 Unsteadiness on feet: Secondary | ICD-10-CM | POA: Diagnosis not present

## 2018-02-07 DIAGNOSIS — R41841 Cognitive communication deficit: Secondary | ICD-10-CM | POA: Diagnosis not present

## 2018-02-07 DIAGNOSIS — F0390 Unspecified dementia without behavioral disturbance: Secondary | ICD-10-CM | POA: Diagnosis not present

## 2018-02-10 DIAGNOSIS — E1142 Type 2 diabetes mellitus with diabetic polyneuropathy: Secondary | ICD-10-CM | POA: Diagnosis not present

## 2018-02-10 DIAGNOSIS — I10 Essential (primary) hypertension: Secondary | ICD-10-CM | POA: Diagnosis not present

## 2018-02-10 DIAGNOSIS — F0390 Unspecified dementia without behavioral disturbance: Secondary | ICD-10-CM | POA: Diagnosis not present

## 2018-02-10 DIAGNOSIS — R2681 Unsteadiness on feet: Secondary | ICD-10-CM | POA: Diagnosis not present

## 2018-02-10 DIAGNOSIS — M6281 Muscle weakness (generalized): Secondary | ICD-10-CM | POA: Diagnosis not present

## 2018-02-10 DIAGNOSIS — R41841 Cognitive communication deficit: Secondary | ICD-10-CM | POA: Diagnosis not present

## 2018-02-12 DIAGNOSIS — E1142 Type 2 diabetes mellitus with diabetic polyneuropathy: Secondary | ICD-10-CM | POA: Diagnosis not present

## 2018-02-12 DIAGNOSIS — I10 Essential (primary) hypertension: Secondary | ICD-10-CM | POA: Diagnosis not present

## 2018-02-12 DIAGNOSIS — R2681 Unsteadiness on feet: Secondary | ICD-10-CM | POA: Diagnosis not present

## 2018-02-12 DIAGNOSIS — R41841 Cognitive communication deficit: Secondary | ICD-10-CM | POA: Diagnosis not present

## 2018-02-12 DIAGNOSIS — M6281 Muscle weakness (generalized): Secondary | ICD-10-CM | POA: Diagnosis not present

## 2018-02-12 DIAGNOSIS — F0390 Unspecified dementia without behavioral disturbance: Secondary | ICD-10-CM | POA: Diagnosis not present

## 2018-02-13 DIAGNOSIS — F0390 Unspecified dementia without behavioral disturbance: Secondary | ICD-10-CM | POA: Diagnosis not present

## 2018-02-13 DIAGNOSIS — I10 Essential (primary) hypertension: Secondary | ICD-10-CM | POA: Diagnosis not present

## 2018-02-13 DIAGNOSIS — R41841 Cognitive communication deficit: Secondary | ICD-10-CM | POA: Diagnosis not present

## 2018-02-13 DIAGNOSIS — M6281 Muscle weakness (generalized): Secondary | ICD-10-CM | POA: Diagnosis not present

## 2018-02-13 DIAGNOSIS — E1142 Type 2 diabetes mellitus with diabetic polyneuropathy: Secondary | ICD-10-CM | POA: Diagnosis not present

## 2018-02-13 DIAGNOSIS — R2681 Unsteadiness on feet: Secondary | ICD-10-CM | POA: Diagnosis not present

## 2018-02-14 DIAGNOSIS — F0390 Unspecified dementia without behavioral disturbance: Secondary | ICD-10-CM | POA: Diagnosis not present

## 2018-02-14 DIAGNOSIS — R2681 Unsteadiness on feet: Secondary | ICD-10-CM | POA: Diagnosis not present

## 2018-02-14 DIAGNOSIS — R41841 Cognitive communication deficit: Secondary | ICD-10-CM | POA: Diagnosis not present

## 2018-02-14 DIAGNOSIS — I10 Essential (primary) hypertension: Secondary | ICD-10-CM | POA: Diagnosis not present

## 2018-02-14 DIAGNOSIS — M6281 Muscle weakness (generalized): Secondary | ICD-10-CM | POA: Diagnosis not present

## 2018-02-14 DIAGNOSIS — E1142 Type 2 diabetes mellitus with diabetic polyneuropathy: Secondary | ICD-10-CM | POA: Diagnosis not present

## 2018-02-17 DIAGNOSIS — I10 Essential (primary) hypertension: Secondary | ICD-10-CM | POA: Diagnosis not present

## 2018-02-17 DIAGNOSIS — R2681 Unsteadiness on feet: Secondary | ICD-10-CM | POA: Diagnosis not present

## 2018-02-17 DIAGNOSIS — M6281 Muscle weakness (generalized): Secondary | ICD-10-CM | POA: Diagnosis not present

## 2018-02-17 DIAGNOSIS — R41841 Cognitive communication deficit: Secondary | ICD-10-CM | POA: Diagnosis not present

## 2018-02-17 DIAGNOSIS — E1142 Type 2 diabetes mellitus with diabetic polyneuropathy: Secondary | ICD-10-CM | POA: Diagnosis not present

## 2018-02-17 DIAGNOSIS — F0390 Unspecified dementia without behavioral disturbance: Secondary | ICD-10-CM | POA: Diagnosis not present

## 2018-02-18 DIAGNOSIS — R2681 Unsteadiness on feet: Secondary | ICD-10-CM | POA: Diagnosis not present

## 2018-02-18 DIAGNOSIS — I10 Essential (primary) hypertension: Secondary | ICD-10-CM | POA: Diagnosis not present

## 2018-02-18 DIAGNOSIS — F0390 Unspecified dementia without behavioral disturbance: Secondary | ICD-10-CM | POA: Diagnosis not present

## 2018-02-18 DIAGNOSIS — R41841 Cognitive communication deficit: Secondary | ICD-10-CM | POA: Diagnosis not present

## 2018-02-18 DIAGNOSIS — E1142 Type 2 diabetes mellitus with diabetic polyneuropathy: Secondary | ICD-10-CM | POA: Diagnosis not present

## 2018-02-18 DIAGNOSIS — M6281 Muscle weakness (generalized): Secondary | ICD-10-CM | POA: Diagnosis not present

## 2018-02-19 DIAGNOSIS — R41841 Cognitive communication deficit: Secondary | ICD-10-CM | POA: Diagnosis not present

## 2018-02-19 DIAGNOSIS — I10 Essential (primary) hypertension: Secondary | ICD-10-CM | POA: Diagnosis not present

## 2018-02-19 DIAGNOSIS — M6281 Muscle weakness (generalized): Secondary | ICD-10-CM | POA: Diagnosis not present

## 2018-02-19 DIAGNOSIS — R2681 Unsteadiness on feet: Secondary | ICD-10-CM | POA: Diagnosis not present

## 2018-02-19 DIAGNOSIS — E1142 Type 2 diabetes mellitus with diabetic polyneuropathy: Secondary | ICD-10-CM | POA: Diagnosis not present

## 2018-02-19 DIAGNOSIS — F0390 Unspecified dementia without behavioral disturbance: Secondary | ICD-10-CM | POA: Diagnosis not present

## 2018-02-20 DIAGNOSIS — M6281 Muscle weakness (generalized): Secondary | ICD-10-CM | POA: Diagnosis not present

## 2018-02-20 DIAGNOSIS — R41841 Cognitive communication deficit: Secondary | ICD-10-CM | POA: Diagnosis not present

## 2018-02-20 DIAGNOSIS — R2681 Unsteadiness on feet: Secondary | ICD-10-CM | POA: Diagnosis not present

## 2018-02-20 DIAGNOSIS — F0390 Unspecified dementia without behavioral disturbance: Secondary | ICD-10-CM | POA: Diagnosis not present

## 2018-02-20 DIAGNOSIS — I10 Essential (primary) hypertension: Secondary | ICD-10-CM | POA: Diagnosis not present

## 2018-02-20 DIAGNOSIS — E1142 Type 2 diabetes mellitus with diabetic polyneuropathy: Secondary | ICD-10-CM | POA: Diagnosis not present

## 2018-02-21 DIAGNOSIS — Z6826 Body mass index (BMI) 26.0-26.9, adult: Secondary | ICD-10-CM | POA: Diagnosis not present

## 2018-02-21 DIAGNOSIS — H00011 Hordeolum externum right upper eyelid: Secondary | ICD-10-CM | POA: Diagnosis not present

## 2018-02-21 DIAGNOSIS — E663 Overweight: Secondary | ICD-10-CM | POA: Diagnosis not present

## 2018-02-25 DIAGNOSIS — I10 Essential (primary) hypertension: Secondary | ICD-10-CM | POA: Diagnosis not present

## 2018-02-25 DIAGNOSIS — M6281 Muscle weakness (generalized): Secondary | ICD-10-CM | POA: Diagnosis not present

## 2018-02-25 DIAGNOSIS — F0390 Unspecified dementia without behavioral disturbance: Secondary | ICD-10-CM | POA: Diagnosis not present

## 2018-02-25 DIAGNOSIS — R41841 Cognitive communication deficit: Secondary | ICD-10-CM | POA: Diagnosis not present

## 2018-02-25 DIAGNOSIS — R2681 Unsteadiness on feet: Secondary | ICD-10-CM | POA: Diagnosis not present

## 2018-02-25 DIAGNOSIS — E1142 Type 2 diabetes mellitus with diabetic polyneuropathy: Secondary | ICD-10-CM | POA: Diagnosis not present

## 2018-02-26 DIAGNOSIS — F0390 Unspecified dementia without behavioral disturbance: Secondary | ICD-10-CM | POA: Diagnosis not present

## 2018-02-26 DIAGNOSIS — M6281 Muscle weakness (generalized): Secondary | ICD-10-CM | POA: Diagnosis not present

## 2018-02-26 DIAGNOSIS — R41841 Cognitive communication deficit: Secondary | ICD-10-CM | POA: Diagnosis not present

## 2018-02-26 DIAGNOSIS — R2681 Unsteadiness on feet: Secondary | ICD-10-CM | POA: Diagnosis not present

## 2018-02-26 DIAGNOSIS — E1142 Type 2 diabetes mellitus with diabetic polyneuropathy: Secondary | ICD-10-CM | POA: Diagnosis not present

## 2018-02-26 DIAGNOSIS — I10 Essential (primary) hypertension: Secondary | ICD-10-CM | POA: Diagnosis not present

## 2018-02-26 DIAGNOSIS — Z79899 Other long term (current) drug therapy: Secondary | ICD-10-CM | POA: Diagnosis not present

## 2018-02-27 DIAGNOSIS — I10 Essential (primary) hypertension: Secondary | ICD-10-CM | POA: Diagnosis not present

## 2018-02-27 DIAGNOSIS — E1142 Type 2 diabetes mellitus with diabetic polyneuropathy: Secondary | ICD-10-CM | POA: Diagnosis not present

## 2018-02-27 DIAGNOSIS — R41841 Cognitive communication deficit: Secondary | ICD-10-CM | POA: Diagnosis not present

## 2018-02-27 DIAGNOSIS — M6281 Muscle weakness (generalized): Secondary | ICD-10-CM | POA: Diagnosis not present

## 2018-02-27 DIAGNOSIS — F0390 Unspecified dementia without behavioral disturbance: Secondary | ICD-10-CM | POA: Diagnosis not present

## 2018-02-27 DIAGNOSIS — R2681 Unsteadiness on feet: Secondary | ICD-10-CM | POA: Diagnosis not present

## 2018-03-03 DIAGNOSIS — E1142 Type 2 diabetes mellitus with diabetic polyneuropathy: Secondary | ICD-10-CM | POA: Diagnosis not present

## 2018-03-03 DIAGNOSIS — F0390 Unspecified dementia without behavioral disturbance: Secondary | ICD-10-CM | POA: Diagnosis not present

## 2018-03-03 DIAGNOSIS — M6281 Muscle weakness (generalized): Secondary | ICD-10-CM | POA: Diagnosis not present

## 2018-03-03 DIAGNOSIS — R41841 Cognitive communication deficit: Secondary | ICD-10-CM | POA: Diagnosis not present

## 2018-03-03 DIAGNOSIS — R2681 Unsteadiness on feet: Secondary | ICD-10-CM | POA: Diagnosis not present

## 2018-03-03 DIAGNOSIS — I10 Essential (primary) hypertension: Secondary | ICD-10-CM | POA: Diagnosis not present

## 2018-03-05 DIAGNOSIS — I10 Essential (primary) hypertension: Secondary | ICD-10-CM | POA: Diagnosis not present

## 2018-03-05 DIAGNOSIS — G301 Alzheimer's disease with late onset: Secondary | ICD-10-CM | POA: Diagnosis not present

## 2018-03-05 DIAGNOSIS — G219 Secondary parkinsonism, unspecified: Secondary | ICD-10-CM | POA: Diagnosis not present

## 2018-03-05 DIAGNOSIS — E1140 Type 2 diabetes mellitus with diabetic neuropathy, unspecified: Secondary | ICD-10-CM | POA: Diagnosis not present

## 2018-03-06 DIAGNOSIS — R2681 Unsteadiness on feet: Secondary | ICD-10-CM | POA: Diagnosis not present

## 2018-03-06 DIAGNOSIS — R41841 Cognitive communication deficit: Secondary | ICD-10-CM | POA: Diagnosis not present

## 2018-03-06 DIAGNOSIS — M6281 Muscle weakness (generalized): Secondary | ICD-10-CM | POA: Diagnosis not present

## 2018-03-06 DIAGNOSIS — I10 Essential (primary) hypertension: Secondary | ICD-10-CM | POA: Diagnosis not present

## 2018-03-06 DIAGNOSIS — F0390 Unspecified dementia without behavioral disturbance: Secondary | ICD-10-CM | POA: Diagnosis not present

## 2018-03-06 DIAGNOSIS — E1142 Type 2 diabetes mellitus with diabetic polyneuropathy: Secondary | ICD-10-CM | POA: Diagnosis not present

## 2018-04-09 DIAGNOSIS — E119 Type 2 diabetes mellitus without complications: Secondary | ICD-10-CM | POA: Diagnosis not present

## 2018-04-09 DIAGNOSIS — F0390 Unspecified dementia without behavioral disturbance: Secondary | ICD-10-CM | POA: Diagnosis not present

## 2018-04-09 DIAGNOSIS — G20 Parkinson's disease: Secondary | ICD-10-CM | POA: Diagnosis not present

## 2018-04-09 DIAGNOSIS — M1991 Primary osteoarthritis, unspecified site: Secondary | ICD-10-CM | POA: Diagnosis not present

## 2018-04-09 DIAGNOSIS — Z1389 Encounter for screening for other disorder: Secondary | ICD-10-CM | POA: Diagnosis not present

## 2018-04-09 DIAGNOSIS — Z0001 Encounter for general adult medical examination with abnormal findings: Secondary | ICD-10-CM | POA: Diagnosis not present

## 2018-04-09 DIAGNOSIS — Z6826 Body mass index (BMI) 26.0-26.9, adult: Secondary | ICD-10-CM | POA: Diagnosis not present

## 2018-04-09 DIAGNOSIS — I10 Essential (primary) hypertension: Secondary | ICD-10-CM | POA: Diagnosis not present

## 2018-05-01 ENCOUNTER — Telehealth: Payer: Self-pay | Admitting: Neurology

## 2018-05-01 DIAGNOSIS — F0390 Unspecified dementia without behavioral disturbance: Secondary | ICD-10-CM | POA: Diagnosis not present

## 2018-05-01 DIAGNOSIS — Z6826 Body mass index (BMI) 26.0-26.9, adult: Secondary | ICD-10-CM | POA: Diagnosis not present

## 2018-05-01 DIAGNOSIS — R39198 Other difficulties with micturition: Secondary | ICD-10-CM | POA: Diagnosis not present

## 2018-05-01 DIAGNOSIS — E663 Overweight: Secondary | ICD-10-CM | POA: Diagnosis not present

## 2018-05-01 NOTE — Telephone Encounter (Signed)
Patient's husband has been acting different. She said very confused as well as not aware of what is going on. I did instruct her to take hi to the ER or urgent care. Thanks

## 2018-05-05 NOTE — Telephone Encounter (Signed)
Patient called regarding her husband and him going to the ER last week. 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 His appt has been moved up to August from October. Should he be seen sooner? Please Advise. Thanks

## 2018-05-05 NOTE — Telephone Encounter (Signed)
Returned call.  No answer.  VM box not set up.  Unable to leave message.

## 2018-05-28 ENCOUNTER — Encounter: Payer: Self-pay | Admitting: Neurology

## 2018-05-28 ENCOUNTER — Other Ambulatory Visit: Payer: Self-pay

## 2018-05-28 ENCOUNTER — Ambulatory Visit (INDEPENDENT_AMBULATORY_CARE_PROVIDER_SITE_OTHER): Payer: Medicare Other | Admitting: Neurology

## 2018-05-28 VITALS — BP 120/70 | HR 66 | Ht 68.0 in | Wt 183.1 lb

## 2018-05-28 DIAGNOSIS — G20 Parkinson's disease: Secondary | ICD-10-CM | POA: Diagnosis not present

## 2018-05-28 DIAGNOSIS — F03B18 Unspecified dementia, moderate, with other behavioral disturbance: Secondary | ICD-10-CM

## 2018-05-28 DIAGNOSIS — F0391 Unspecified dementia with behavioral disturbance: Secondary | ICD-10-CM

## 2018-05-28 MED ORDER — QUETIAPINE FUMARATE 25 MG PO TABS: ORAL_TABLET | ORAL | 5 refills | Status: DC

## 2018-05-28 NOTE — Patient Instructions (Addendum)
1. Start Seroquel 25mg : Take 1/2 tablet every night. If no side effects, we can increase dose to 1/2 tablet twice a day 2. Start looking into getting more help at home 3. Follow-up as scheduled in October  FALL PRECAUTIONS: Be cautious when walking. Scan the area for obstacles that may increase the risk of trips and falls. When getting up in the mornings, sit up at the edge of the bed for a few minutes before getting out of bed. Consider elevating the bed at the head end to avoid drop of blood pressure when getting up. Walk always in a well-lit room (use night lights in the walls). Avoid area rugs or power cords from appliances in the middle of the walkways. Use a walker or a cane if necessary and consider physical therapy for balance exercise. Get your eyesight checked regularly.  HOME SAFETY: Consider the safety of the kitchen when operating appliances like stoves, microwave oven, and blender. Consider having supervision and share cooking responsibilities until no longer able to participate in those. Accidents with firearms and other hazards in the house should be identified and addressed as well.  ABILITY TO BE LEFT ALONE: If patient is unable to contact 911 operator, consider using LifeLine, or when the need is there, arrange for someone to stay with patients. Smoking is a fire hazard, consider supervision or cessation. Risk of wandering should be assessed by caregiver and if detected at any point, supervision and safe proof recommendations should be instituted.  RECOMMENDATIONS FOR ALL PATIENTS WITH MEMORY PROBLEMS: 1. Continue to exercise (Recommend 30 minutes of walking everyday, or 3 hours every week) 2. Increase social interactions - continue going to [place] and enjoy social gatherings with friends and family 3. Eat healthy, avoid fried foods and eat more fruits and vegetables 4. Maintain adequate blood pressure, blood sugar, and blood cholesterol level. Reducing the risk of stroke and  cardiovascular disease also helps promoting better memory. 5. Avoid stressful situations. Live a simple life and avoid aggravations. Organize your time and prepare for the next day in anticipation. 6. Sleep well, avoid any interruptions of sleep and avoid any distractions in the bedroom that may interfere with adequate sleep quality 7. Avoid sugar, avoid sweets as there is a strong link between excessive sugar intake, diabetes, and cognitive impairment The Mediterranean diet has been shown to help patients reduce the risk of progressive memory disorders and reduces cardiovascular risk. This includes eating fish, eat fruits and green leafy vegetables, nuts like almonds and hazelnuts, walnuts, and also use olive oil. Avoid fast foods and fried foods as much as possible. Avoid sweets and sugar as sugar use has been linked to worsening of memory function.  There is always a concern of gradual progression of memory problems. If this is the case, then we may need to adjust level of care according to patient needs. Support, both to the patient and caregiver, should then be put into place.

## 2018-05-28 NOTE — Progress Notes (Signed)
NEUROLOGY FOLLOW UP OFFICE NOTE  Dustin [name] [id number]  DOB: April 13, 1946  HISTORY OF PRESENT ILLNESS: I had the pleasure of seeing Dustin [name] in follow-up in the neurology clinic on 05/28/2018.  The patient was last seen 4 months ago for moderate dementia with behavioral disturbance. MMSE in April 2019 was 15/30 (18/30 in October 2018, 23/30 in October 2017, 26/30 in January 2017). He is again accompanied by his wife who helps supplement the history today. She brings a note about his symptoms. He sleeps good most nights but also sleeps a good deal in the daytime. He eats a lot, she feels he does not know when he is hungry or not. He says he does not want to eat but after that he wants to eat every hour, especially at night. He goes to bed early but wanders around at 3am or 5am. He has more personality changes, he can be very il and short-tempered, cussing a lot, which is new and very difficult for her. When asked about his memory, he states "I don't know how to word it to you." His wife helps him with dressing and bathing and needs to give a lot of instructions. Their son now spends the night with them and gives him a shower in the mornings. He has occasional hallucinations seeing 100 or 300 deer outside their house. He is wife called in July to report he was acting different, more confused, hallucinating, unable to walk. He was brought to the ER, records unavailable for review, wife reports he was treated for cough and improved. He has seen Dr. Merlene Laughter for evaluation of Parkinson's disease, but his wife reports getting upset with him so it is unclear about diagnosis.   History on Initial Assessment 11/10/2015: This is a pleasant 72 yo LH man with a history of hypertension, hyperlipidemia, diabetes, who presented with worsening memory. His wife started noticing changes around summertime 2016, after getting home from a barbeque, he wanted to eat. She said she would heat up the bbq then he started  cussing and saying he did not like bbq. His wife reminded him that he ate it earlier, and he did eat it later that day. His wife noticed he was wobbly and sat him down, then he could not get out of the chair and was confused. He did not seek medical attention, he was back to baseline the next day with no recollection of the event. Since then, his wife has noticed that if she says or does something he does not understand, he would get very upset with her. He states he does not remember this, his wife reports "he thinks I am fussing at him." She has seen him standing in the hall looking like he does not know where to go. He occasionally has a distant look and would not respond or would ask her "what's wrong?" They frequently go to the beach and he loves to fish, but one time their alarm system went down and he refused to leave because he was so worried someone would come in their house. When his wife does the laundry, he would not leave the washing machine because he would be afraid a pipe might burst. He himself reports that "other than just forgetting, memory is pretty good." He puts something down and forgets where he put it. He denies getting lost driving, no missed medications, word-finding difficulties. His wife is in charge of bills. 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 His father had Alzheimer's disease. No history of head injuries or alcohol use. His wife has noticed he drives slowly that she does most of the driving. Over the past couple of years, she noticed he would stop at a green light or go on even with a red light.   PAST MEDICAL HISTORY: Past Medical History:  Diagnosis Date  . Abdominal hernia   . Coronary atherosclerosis of native coronary artery 2003   a. NSTEMI s/p BMS to distal circumflex and PDA 2003. b. Cath 2007: patent stents with moderate mid to distal LAD  disease.  . Diabetes mellitus, type 2 ([place])   . Diastolic dysfunction    Grade 2. EF 55-60%  . Diverticulosis   . Essential hypertension, benign   . GERD (gastroesophageal reflux disease)   . Hyperlipidemia   . Irritable bowel syndrome   . Nephrolithiasis   . NSTEMI (non-ST elevated myocardial infarction) [facility])     MEDICATIONS: Current Outpatient Medications on File Prior to Visit  Medication Sig Dispense Refill  . amLODipine (NORVASC) 5 MG tablet Take 5 mg by mouth daily.    Marland Kitchen aspirin EC 81 MG tablet Take 81 mg by mouth daily.    Marland Kitchen atorvastatin (LIPITOR) 40 MG tablet Take 40 mg by mouth daily.    Marland Kitchen escitalopram (LEXAPRO) 10 MG tablet Take 2 tablets every day 60 tablet 6  . fenofibrate (TRICOR) 145 MG tablet Take 1 tablet by mouth daily.    . fish oil-omega-3 fatty acids 1000 MG capsule Take 2 g by mouth 3 (three) times daily.     Marland Kitchen glipiZIDE (GLUCOTROL) 10 MG tablet Take 1 tablet by mouth daily.    Marland Kitchen HYDROcodone-acetaminophen (NORCO/VICODIN) 5-325 MG tablet Take 1 tablet by mouth every 6 (six) hours as needed.   0  . lisinopril (PRINIVIL,ZESTRIL) 2.5 MG tablet     . Melatonin 3 MG CAPS Take 2 capsules every night 60 capsule 11  . metFORMIN (GLUCOPHAGE) 500 MG tablet Take 2 tablets by mouth 2 (two) times daily.    . nitroGLYCERIN (NITROSTAT) 0.4 MG SL tablet Place 1 tablet (0.4 mg total) under the tongue every 5 (five) minutes as needed for chest pain. 25 tablet 3  . PROAIR RESPICLICK 865 (90 Base) MCG/ACT AEPB 2 puffs once daily  0  . zolpidem (AMBIEN) 5 MG tablet take 1 tablet by mouth at bedtime for sleep as needed  0   No current facility-administered medications on file prior to visit.     ALLERGIES: Allergies  Allergen Reactions  . Morphine And Related Nausea And Vomiting  . Ziac [Bisoprolol-Hydrochlorothiazide]     Unknown reaction    FAMILY HISTORY: Family History  Problem Relation Age of Onset  . Heart attack Mother   . Hyperlipidemia Mother   . Diabetes Mother    . Heart attack Father   . Hyperlipidemia Father   . Heart attack Sister   . Hyperlipidemia Sister     SOCIAL HISTORY: Social History   Socioeconomic History  . Marital status: Married    Spouse name: Not on file  . Number of children: 4  . Years of education: Not on file  . Highest education level: Not on file  Occupational History  . Occupation: IT sales professional: [employer]  . Financial resource strain: Not on file  . Food insecurity:    Worry:  Not on file    Inability: Not on file  . Transportation needs:    Medical: Not on file    Non-medical: Not on file  Tobacco Use  . Smoking status: Never Smoker  . Smokeless tobacco: Never Used  Substance and Sexual Activity  . Alcohol use: No    Alcohol/week: 0.0 oz  . Drug use: No  . Sexual activity: Not on file  Lifestyle  . Physical activity:    Days per week: Not on file    Minutes per session: Not on file  . Stress: Not on file  Relationships  . Social connections:    Talks on phone: Not on file    Gets together: Not on file    Attends religious service: Not on file    Active member of club or organization: Not on file    Attends meetings of clubs or organizations: Not on file    Relationship status: Not on file  . Intimate partner violence:    Fear of current or ex partner: Not on file    Emotionally abused: Not on file    Physically abused: Not on file    Forced sexual activity: Not on file  Other Topics Concern  . Not on file  Social History Narrative  . Not on file    REVIEW OF SYSTEMS: Constitutional: No fevers, chills, or sweats, no generalized fatigue, change in appetite Eyes: No visual changes, double vision, eye pain Ear, nose and throat: No hearing loss, ear pain, nasal congestion, sore throat Cardiovascular: No chest pain, palpitations Respiratory:  No shortness of breath at rest or with exertion, wheezes GastrointestinaI: No nausea, vomiting, diarrhea,  abdominal pain, fecal incontinence Genitourinary:  No dysuria, urinary retention or frequency Musculoskeletal:  No neck pain, back pain Integumentary: No rash, pruritus, skin lesions Neurological: as above Psychiatric: No depression, insomnia, anxiety Endocrine: No palpitations, fatigue, diaphoresis, mood swings, change in appetite, change in weight, increased thirst Hematologic/Lymphatic:  No anemia, purpura, petechiae. Allergic/Immunologic: no itchy/runny eyes, nasal congestion, recent allergic reactions, rashes  PHYSICAL EXAM: Vitals:   05/28/18 1314  BP: 120/70  Pulse: 66  SpO2: 96%   General: No acute distress, flat affect Head:  Normocephalic/atraumatic Neck: supple, no paraspinal tenderness, full range of motion Heart:  Regular rate and rhythm Lungs:  Clear to auscultation bilaterally Back: No paraspinal tenderness Skin/Extremities: No rash, no edema Neurological Exam: alert and oriented to person. He has more word-finding difficulties, unable to complete MMSE today. 0/3 recent and remote memory. Attention and concentration are reduced, unable to spell WORLD backward.    Able to name objects and repeat phrases. Cranial nerves: Pupils equal, round, reactive to light.  Extraocular movements intact with no nystagmus. Visual fields full. Facial sensation intact. No facial asymmetry. Tongue, uvula, palate midline.  Motor: Bulk normal, mild bilateral cogwheeling L>R, muscle strength 5/5 throughout with no pronator drift.  Sensation to light touch intact.  No extinction to double simultaneous stimulation.  Deep tendon reflexes 2+ throughout, toes downgoing.  Finger to nose testing intact.  Gait slow and cautious, unsteady with cane, +postural instability. There is a mild left thumb resting tremor, no postural tremor, +bilateral endpoint tremor, good finger taps (similar to prior)  IMPRESSION: This is a pleasant 72 yo LH man with vascular risk factors including hypertension, hyperlipidemia,  diabetes, with moderate dementia with behavioral disturbance. He continues to have progressive decline, unable to complete MMSE today as he is having more word-finding difficulties. He is taking Donepezil 10mg   daily and Lexapro 20mg  daily. He is having more behavioral changes with hallucinations and increasing agitation/cursing. His wife is agreeable to starting low dose Seroquel 25mg  1/2 tab qhs, side effects, including black box cardiac warning were discussed. We can uptitrate to BID dosing as tolerated. I had an extensive discussion with his wife about getting more help at home, resources were given today. He does have parkinsonian signs, but we have agreed to focus on treatment of behavioral changes at this time. Continue 24/7 care. He will follow-up as scheduled in October, his wife knows to call for any changes.  Thank you for allowing me to participate in his care.  Please do not hesitate to call for any questions or concerns.  The duration of this appointment visit was 30 minutes of face-to-face time with the patient.  Greater than 50% of this time was spent in counseling, explanation of diagnosis, planning of further management, and coordination of care.   Ellouise Newer, M.D.   CC: Dr. Gerarda Fraction

## 2018-06-09 ENCOUNTER — Telehealth: Payer: Self-pay | Admitting: Neurology

## 2018-06-09 NOTE — Telephone Encounter (Signed)
Patient's wife called late Friday afternoon regarding her husband. She said that he got sick the other day and did not pass out but went through the motions and fell but did not hurt himself. Thanks

## 2018-06-09 NOTE — Telephone Encounter (Signed)
noted 

## 2018-06-11 ENCOUNTER — Ambulatory Visit: Payer: PRIVATE HEALTH INSURANCE | Admitting: Neurology

## 2018-06-11 ENCOUNTER — Encounter

## 2018-06-11 ENCOUNTER — Telehealth: Payer: Self-pay | Admitting: Neurology

## 2018-06-11 NOTE — Telephone Encounter (Signed)
Does she want to try a different medication that can potentially help with mood stabilization or just keep an eye on things for now? If she would like to try, would start Depakote ER 250mg  qhs, side effect mostly drowsiness. Thanks

## 2018-06-11 NOTE — Telephone Encounter (Signed)
Patient's wife took patient off Quetiapine 25mg  because it made him 3xs worse. Patient was hateful. He was sick on stomach. Went to Dr.fusco and he put him on meclizine medication for stomach. If you need to call her it's [phone]. Thanks.

## 2018-06-12 NOTE — Telephone Encounter (Signed)
Called pt's wife.  No answer.  LMOM asking for return call to relay message below.

## 2018-06-13 NOTE — Telephone Encounter (Signed)
Pt's wife, Posey Pronto, returned my call.  States that at this time she would rather just keep an eye on pt since he seems to be extra sensitive to medications.  Ian Bushman that if she changes her mind to give me a call and will send Rx for Depakote at that time.

## 2018-07-21 DIAGNOSIS — E1140 Type 2 diabetes mellitus with diabetic neuropathy, unspecified: Secondary | ICD-10-CM | POA: Diagnosis not present

## 2018-07-21 DIAGNOSIS — Z6825 Body mass index (BMI) 25.0-25.9, adult: Secondary | ICD-10-CM | POA: Diagnosis not present

## 2018-07-21 DIAGNOSIS — F0390 Unspecified dementia without behavioral disturbance: Secondary | ICD-10-CM | POA: Diagnosis not present

## 2018-07-21 DIAGNOSIS — M1991 Primary osteoarthritis, unspecified site: Secondary | ICD-10-CM | POA: Diagnosis not present

## 2018-07-21 DIAGNOSIS — R201 Hypoesthesia of skin: Secondary | ICD-10-CM | POA: Diagnosis not present

## 2018-07-21 DIAGNOSIS — I10 Essential (primary) hypertension: Secondary | ICD-10-CM | POA: Diagnosis not present

## 2018-07-21 DIAGNOSIS — Z1389 Encounter for screening for other disorder: Secondary | ICD-10-CM | POA: Diagnosis not present

## 2018-07-21 DIAGNOSIS — G894 Chronic pain syndrome: Secondary | ICD-10-CM | POA: Diagnosis not present

## 2018-07-21 DIAGNOSIS — E7849 Other hyperlipidemia: Secondary | ICD-10-CM | POA: Diagnosis not present

## 2018-08-11 ENCOUNTER — Ambulatory Visit (INDEPENDENT_AMBULATORY_CARE_PROVIDER_SITE_OTHER): Payer: Medicare Other | Admitting: Neurology

## 2018-08-11 ENCOUNTER — Encounter: Payer: Self-pay | Admitting: Neurology

## 2018-08-11 ENCOUNTER — Other Ambulatory Visit: Payer: Self-pay

## 2018-08-11 VITALS — BP 152/86 | HR 76 | Ht 68.0 in | Wt 184.0 lb

## 2018-08-11 DIAGNOSIS — F03B18 Unspecified dementia, moderate, with other behavioral disturbance: Secondary | ICD-10-CM

## 2018-08-11 DIAGNOSIS — F0391 Unspecified dementia with behavioral disturbance: Secondary | ICD-10-CM | POA: Diagnosis not present

## 2018-08-11 DIAGNOSIS — F419 Anxiety disorder, unspecified: Secondary | ICD-10-CM

## 2018-08-11 MED ORDER — ESCITALOPRAM OXALATE 10 MG PO TABS: ORAL_TABLET | ORAL | 3 refills | Status: DC

## 2018-08-11 MED ORDER — DIVALPROEX SODIUM ER 250 MG PO TB24
ORAL_TABLET | ORAL | 11 refills | Status: DC
Start: 2018-08-11 — End: 2019-03-09

## 2018-08-11 NOTE — Patient Instructions (Signed)
1. Depakote ER 250mg  is a mood stabilizer which can potentially help with calming him down. Start 1 tablet every night once ready  2. Continue Escitalopram 10mg : take 2 tablets daily  3. Strongly recommend having more help at home on a regular basis  4. Follow-up in 6 months or so, call for any changes  FALL PRECAUTIONS: Be cautious when walking. Scan the area for obstacles that may increase the risk of trips and falls. When getting up in the mornings, sit up at the edge of the bed for a few minutes before getting out of bed. Consider elevating the bed at the head end to avoid drop of blood pressure when getting up. Walk always in a well-lit room (use night lights in the walls). Avoid area rugs or power cords from appliances in the middle of the walkways. Use a walker or a cane if necessary and consider physical therapy for balance exercise. Get your eyesight checked regularly.  HOME SAFETY: Consider the safety of the kitchen when operating appliances like stoves, microwave oven, and blender. Consider having supervision and share cooking responsibilities until no longer able to participate in those. Accidents with firearms and other hazards in the house should be identified and addressed as well.  ABILITY TO BE LEFT ALONE: If patient is unable to contact 911 operator, consider using LifeLine, or when the need is there, arrange for someone to stay with patients. Smoking is a fire hazard, consider supervision or cessation. Risk of wandering should be assessed by caregiver and if detected at any point, supervision and safe proof recommendations should be instituted.  RECOMMENDATIONS FOR ALL PATIENTS WITH MEMORY PROBLEMS: 1. Continue to exercise (Recommend 30 minutes of walking everyday, or 3 hours every week) 2. Increase social interactions - continue going to [place] and enjoy social gatherings with friends and family 3. Eat healthy, avoid fried foods and eat more fruits and vegetables 4. Maintain  adequate blood pressure, blood sugar, and blood cholesterol level. Reducing the risk of stroke and cardiovascular disease also helps promoting better memory. 5. Avoid stressful situations. Live a simple life and avoid aggravations. Organize your time and prepare for the next day in anticipation. 6. Sleep well, avoid any interruptions of sleep and avoid any distractions in the bedroom that may interfere with adequate sleep quality 7. Avoid sugar, avoid sweets as there is a strong link between excessive sugar intake, diabetes, and cognitive impairment We discussed the Mediterranean diet, which has been shown to help patients reduce the risk of progressive memory disorders and reduces cardiovascular risk. This includes eating fish, eat fruits and green leafy vegetables, nuts like almonds and hazelnuts, walnuts, and also use olive oil. Avoid fast foods and fried foods as much as possible. Avoid sweets and sugar as sugar use has been linked to worsening of memory function.  There is always a concern of gradual progression of memory problems. If this is the case, then we may need to adjust level of care according to patient needs. Support, both to the patient and caregiver, should then be put into place.

## 2018-08-11 NOTE — Progress Notes (Signed)
NEUROLOGY FOLLOW UP OFFICE NOTE  Dustin [name] [id number]  DOB: 1945-12-30  HISTORY OF PRESENT ILLNESS: I had the pleasure of seeing Dustin [name] in follow-up in the neurology clinic on 08/11/2018. He is again accompanied by his wife who helps supplement the history today. The patient was last seen 2 months ago for moderate dementia with behavioral disturbance. Unable to complete MMSE last August 2019 due to increasing word-finding difficulties. On his last visit, his wife reported more wandering at night, personality changes with cussing. He was also having hallucinations. He was started on Seroquel which per wife "made things 3x worse." Depakote was offered, but his wife declined. Since his last visit, his wife reports he is getting much worse at night, as well as during the day. He has a hard time finding his way to each room and cannot recall what he went to the room for. He gets irritated very easily, cussing when he gets upset. He has more hallucinations and gets upset when his wife says she does not see it. She does everything for him, showering, dressing, putting on shoes, shaving. An aide comes 4 hours a day for 4 days but she feels it is not much help. He has difficulty walking at times and loses his balance. His wife reports a few falls when he forgets to use his cane. He denies any headaches, dizziness, vision changes.   History on Initial Assessment 11/10/2015: This is a pleasant 72 yo LH man with a history of hypertension, hyperlipidemia, diabetes, who presented with worsening memory. His wife started noticing changes around summertime 2016, after getting home from a barbeque, he wanted to eat. She said she would heat up the bbq then he started cussing and saying he did not like bbq. His wife reminded him that he ate it earlier, and he did eat it later that day. His wife noticed he was wobbly and sat him down, then he could not get out of the chair and was confused. He did not seek  medical attention, he was back to baseline the next day with no recollection of the event. Since then, his wife has noticed that if she says or does something he does not understand, he would get very upset with her. He states he does not remember this, his wife reports "he thinks I am fussing at him." She has seen him standing in the hall looking like he does not know where to go. He occasionally has a distant look and would not respond or would ask her "what's wrong?" They frequently go to the beach and he loves to fish, but one time their alarm system went down and he refused to leave because he was so worried someone would come in their house. When his wife does the laundry, he would not leave the washing machine because he would be afraid a pipe might burst. He himself reports that "other than just forgetting, memory is pretty good." He puts something down and forgets where he put it. He denies getting lost driving, no missed medications, word-finding difficulties. His wife is in charge of bills. 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 His father had Alzheimer's disease. No history of head injuries or alcohol use. His wife has noticed he drives slowly that she does most of the driving. Over the past couple of years, she noticed he would stop  at a green light or go on even with a red light.   PAST MEDICAL HISTORY: Past Medical History:  Diagnosis Date  . Abdominal hernia   . Coronary atherosclerosis of native coronary artery 2003   a. NSTEMI s/p BMS to distal circumflex and PDA 2003. b. Cath 2007: patent stents with moderate mid to distal LAD disease.  . Diabetes mellitus, type 2 ([place])   . Diastolic dysfunction    Grade 2. EF 55-60%  . Diverticulosis   . Essential hypertension, benign   . GERD (gastroesophageal reflux disease)   . Hyperlipidemia   . Irritable bowel syndrome     . Nephrolithiasis   . NSTEMI (non-ST elevated myocardial infarction) [facility])     MEDICATIONS: Current Outpatient Medications on File Prior to Visit  Medication Sig Dispense Refill  . amLODipine (NORVASC) 10 MG tablet     . amLODipine (NORVASC) 5 MG tablet Take 5 mg by mouth daily.    Marland Kitchen aspirin EC 81 MG tablet Take 81 mg by mouth daily.    Marland Kitchen atorvastatin (LIPITOR) 40 MG tablet Take 40 mg by mouth daily.    Marland Kitchen erythromycin ophthalmic ointment     . escitalopram (LEXAPRO) 10 MG tablet Take 2 tablets every day 60 tablet 6  . fenofibrate (TRICOR) 145 MG tablet Take 1 tablet by mouth daily.    . fish oil-omega-3 fatty acids 1000 MG capsule Take 2 g by mouth 3 (three) times daily.     Marland Kitchen glipiZIDE (GLUCOTROL) 10 MG tablet Take 1 tablet by mouth daily.    . hydrochlorothiazide (HYDRODIURIL) 25 MG tablet     . HYDROcodone-acetaminophen (NORCO/VICODIN) 5-325 MG tablet Take 1 tablet by mouth every 6 (six) hours as needed.   0  . levofloxacin (LEVAQUIN) 500 MG tablet     . lisinopril (PRINIVIL,ZESTRIL) 2.5 MG tablet     . losartan (COZAAR) 25 MG tablet     . Melatonin 3 MG CAPS Take 2 capsules every night 60 capsule 11  . memantine (NAMENDA) 5 MG tablet     . metFORMIN (GLUCOPHAGE) 500 MG tablet Take 2 tablets by mouth 2 (two) times daily.    . nitroGLYCERIN (NITROSTAT) 0.4 MG SL tablet Place 1 tablet (0.4 mg total) under the tongue every 5 (five) minutes as needed for chest pain. 25 tablet 3  . PROAIR RESPICLICK 539 (90 Base) MCG/ACT AEPB 2 puffs once daily  0  . QUEtiapine (SEROQUEL) 25 MG tablet Take 1/2 tablet every night 15 tablet 5  . zolpidem (AMBIEN) 5 MG tablet take 1 tablet by mouth at bedtime for sleep as needed  0   No current facility-administered medications on file prior to visit.     ALLERGIES: Allergies  Allergen Reactions  . Morphine And Related Nausea And Vomiting  . Ziac [Bisoprolol-Hydrochlorothiazide]     Unknown reaction    FAMILY HISTORY: Family History  Problem  Relation Age of Onset  . Heart attack Mother   . Hyperlipidemia Mother   . Diabetes Mother   . Heart attack Father   . Hyperlipidemia Father   . Heart attack Sister   . Hyperlipidemia Sister     SOCIAL HISTORY: Social History   Socioeconomic History  . Marital status: Married    Spouse name: Not on file  . Number of children: 4  . Years of education: Not on file  . Highest education level: Not on file  Occupational History  . Occupation: IT sales professional: [employer]  Social Needs  . Financial resource strain: Not on file  . Food insecurity:    Worry: Not on file    Inability: Not on file  . Transportation needs:    Medical: Not on file    Non-medical: Not on file  Tobacco Use  . Smoking status: Never Smoker  . Smokeless tobacco: Never Used  Substance and Sexual Activity  . Alcohol use: No    Alcohol/week: 0.0 standard drinks  . Drug use: No  . Sexual activity: Not on file  Lifestyle  . Physical activity:    Days per week: Not on file    Minutes per session: Not on file  . Stress: Not on file  Relationships  . Social connections:    Talks on phone: Not on file    Gets together: Not on file    Attends religious service: Not on file    Active member of club or organization: Not on file    Attends meetings of clubs or organizations: Not on file    Relationship status: Not on file  . Intimate partner violence:    Fear of current or ex partner: Not on file    Emotionally abused: Not on file    Physically abused: Not on file    Forced sexual activity: Not on file  Other Topics Concern  . Not on file  Social History Narrative  . Not on file    REVIEW OF SYSTEMS (patient has dementia and answers "no" to all questions) Constitutional: No fevers, chills, or sweats, no generalized fatigue, change in appetite Eyes: No visual changes, double vision, eye pain Ear, nose and throat: No hearing loss, ear pain, nasal congestion, sore  throat Cardiovascular: No chest pain, palpitations Respiratory:  No shortness of breath at rest or with exertion, wheezes GastrointestinaI: No nausea, vomiting, diarrhea, abdominal pain, fecal incontinence Genitourinary:  No dysuria, urinary retention or frequency Musculoskeletal:  No neck pain, back pain Integumentary: No rash, pruritus, skin lesions Neurological: as above Psychiatric: No depression, insomnia, anxiety Endocrine: No palpitations, fatigue, diaphoresis, mood swings, change in appetite, change in weight, increased thirst Hematologic/Lymphatic:  No anemia, purpura, petechiae. Allergic/Immunologic: no itchy/runny eyes, nasal congestion, recent allergic reactions, rashes  PHYSICAL EXAM: Vitals:   08/11/18 1012  BP: (!) 152/86  Pulse: 76  SpO2: 95%   General: No acute distress, flat affect (similar to prior) Head:  Normocephalic/atraumatic Neck: supple, no paraspinal tenderness, full range of motion Heart:  Regular rate and rhythm Lungs:  Clear to auscultation bilaterally Back: No paraspinal tenderness Skin/Extremities: No rash, no edema Neurological Exam: alert and oriented to person, place (states doctor's office in [place], but unable to say state). He is again noted to have word-finding difficulties, unable to complete MMSE today. 0/3 recent and remote memory. Attention and concentration are reduced, unable to spell WORLD backward.    Able to name objects and repeat phrases. Cranial nerves: Pupils equal, round. No facial asymmetry.Motor: moves all extremities symmetrically. Gait slow and cautious, unsteady with cane, +postural instability. No tremor in office today.  IMPRESSION: This is a pleasant 72 yo LH man with vascular risk factors including hypertension, hyperlipidemia, diabetes, with moderate dementia with behavioral disturbance. He has parkinsonian signs but his wife does not want to start any medications at this time. He continues to have progressive cognitive  decline, his wife clearly needs more help at home but has difficulties accepting it, stating she is the only one who can help him. She has difficulty dealing with  his personality changes, a prescription for Depakote ER 250mg  qhs will be at his pharmacy for mood stabilization, if she wishes to start it. We had an extensive discussion today about prognosis and needing more help at home or looking into Memory Care facilities, which she is very much against at this time. Resources were given to hopefully help her obtain more help at home, continue 24/7 care. Continue Lexapro 20mg  daily. Follow-up in 6 months, she knows to call for any changes.   Thank you for allowing me to participate in his care.  Please do not hesitate to call for any questions or concerns.  The duration of this appointment visit was 27 minutes of face-to-face time with the patient.  Greater than 50% of this time was spent in counseling, explanation of diagnosis, caregiver support, planning of further management, and coordination of care.   Ellouise Newer, M.D.   CC: Dr. Gerarda Fraction

## 2018-10-08 DIAGNOSIS — G894 Chronic pain syndrome: Secondary | ICD-10-CM | POA: Diagnosis not present

## 2018-10-08 DIAGNOSIS — K219 Gastro-esophageal reflux disease without esophagitis: Secondary | ICD-10-CM | POA: Diagnosis not present

## 2018-10-08 DIAGNOSIS — Z6826 Body mass index (BMI) 26.0-26.9, adult: Secondary | ICD-10-CM | POA: Diagnosis not present

## 2018-10-08 DIAGNOSIS — M1991 Primary osteoarthritis, unspecified site: Secondary | ICD-10-CM | POA: Diagnosis not present

## 2018-11-03 ENCOUNTER — Telehealth: Payer: Self-pay | Admitting: Neurology

## 2018-11-03 NOTE — Telephone Encounter (Signed)
Patient wife states to just have her call you back do not say why you are calling

## 2018-11-03 NOTE — Telephone Encounter (Signed)
Patient wife states that she needs a letter stating that he is not able to make decisions please call patient is getting worst since we seen him

## 2018-11-05 ENCOUNTER — Encounter: Payer: Self-pay | Admitting: Neurology

## 2018-11-05 NOTE — Telephone Encounter (Signed)
Spoke with pt's wife, Posey Pronto, advising that letter has been written.  Posey Pronto asks that I send letter via [employer] to her home address.  Posey Pronto states that she has not started pt on Depakote.  States that she will start tonight and call office in 10 days with update.

## 2018-11-05 NOTE — Telephone Encounter (Signed)
Pls call wife to have her call you back, then let her know letter is ready and that with worsening, we can increase Depakote dose further (if she started it), but it sounds like we may be at a point where she is unable to take care of him, we have had multiple discussions about this. Thanks

## 2018-11-07 DIAGNOSIS — Z6825 Body mass index (BMI) 25.0-25.9, adult: Secondary | ICD-10-CM | POA: Diagnosis not present

## 2018-11-07 DIAGNOSIS — E1129 Type 2 diabetes mellitus with other diabetic kidney complication: Secondary | ICD-10-CM | POA: Diagnosis not present

## 2018-11-07 DIAGNOSIS — F05 Delirium due to known physiological condition: Secondary | ICD-10-CM | POA: Diagnosis not present

## 2018-11-07 DIAGNOSIS — E7849 Other hyperlipidemia: Secondary | ICD-10-CM | POA: Diagnosis not present

## 2018-11-07 DIAGNOSIS — F0390 Unspecified dementia without behavioral disturbance: Secondary | ICD-10-CM | POA: Diagnosis not present

## 2018-11-07 DIAGNOSIS — I10 Essential (primary) hypertension: Secondary | ICD-10-CM | POA: Diagnosis not present

## 2018-11-07 DIAGNOSIS — Z1389 Encounter for screening for other disorder: Secondary | ICD-10-CM | POA: Diagnosis not present

## 2018-11-07 DIAGNOSIS — M1991 Primary osteoarthritis, unspecified site: Secondary | ICD-10-CM | POA: Diagnosis not present

## 2018-11-07 DIAGNOSIS — E663 Overweight: Secondary | ICD-10-CM | POA: Diagnosis not present

## 2018-11-20 ENCOUNTER — Encounter (HOSPITAL_COMMUNITY): Payer: Self-pay | Admitting: Emergency Medicine

## 2018-11-20 ENCOUNTER — Observation Stay (HOSPITAL_COMMUNITY)
Admission: EM | Admit: 2018-11-20 | Discharge: 2018-11-21 | Disposition: A | Discharge status: Home / Self Care | Payer: Medicare Other | Attending: Internal Medicine | Admitting: Internal Medicine

## 2018-11-20 ENCOUNTER — Telehealth: Payer: Self-pay | Admitting: Neurology

## 2018-11-20 ENCOUNTER — Other Ambulatory Visit: Payer: Self-pay

## 2018-11-20 ENCOUNTER — Emergency Department (HOSPITAL_COMMUNITY): Payer: Medicare Other

## 2018-11-20 DIAGNOSIS — R0602 Shortness of breath: Secondary | ICD-10-CM | POA: Diagnosis not present

## 2018-11-20 DIAGNOSIS — W19XXXA Unspecified fall, initial encounter: Secondary | ICD-10-CM | POA: Insufficient documentation

## 2018-11-20 DIAGNOSIS — G92 Toxic encephalopathy: Secondary | ICD-10-CM

## 2018-11-20 DIAGNOSIS — R2990 Unspecified symptoms and signs involving the nervous system: Secondary | ICD-10-CM | POA: Diagnosis not present

## 2018-11-20 DIAGNOSIS — R079 Chest pain, unspecified: Secondary | ICD-10-CM | POA: Diagnosis not present

## 2018-11-20 DIAGNOSIS — H6122 Impacted cerumen, left ear: Secondary | ICD-10-CM | POA: Insufficient documentation

## 2018-11-20 DIAGNOSIS — B349 Viral infection, unspecified: Secondary | ICD-10-CM

## 2018-11-20 DIAGNOSIS — M6281 Muscle weakness (generalized): Secondary | ICD-10-CM | POA: Insufficient documentation

## 2018-11-20 DIAGNOSIS — E876 Hypokalemia: Secondary | ICD-10-CM | POA: Diagnosis not present

## 2018-11-20 DIAGNOSIS — G9341 Metabolic encephalopathy: Principal | ICD-10-CM | POA: Insufficient documentation

## 2018-11-20 DIAGNOSIS — R29898 Other symptoms and signs involving the musculoskeletal system: Secondary | ICD-10-CM | POA: Diagnosis not present

## 2018-11-20 DIAGNOSIS — Z8249 Family history of ischemic heart disease and other diseases of the circulatory system: Secondary | ICD-10-CM | POA: Insufficient documentation

## 2018-11-20 DIAGNOSIS — E785 Hyperlipidemia, unspecified: Secondary | ICD-10-CM | POA: Diagnosis not present

## 2018-11-20 DIAGNOSIS — K219 Gastro-esophageal reflux disease without esophagitis: Secondary | ICD-10-CM | POA: Diagnosis not present

## 2018-11-20 DIAGNOSIS — E860 Dehydration: Secondary | ICD-10-CM | POA: Diagnosis not present

## 2018-11-20 DIAGNOSIS — G9340 Encephalopathy, unspecified: Secondary | ICD-10-CM | POA: Diagnosis present

## 2018-11-20 DIAGNOSIS — I6782 Cerebral ischemia: Secondary | ICD-10-CM | POA: Insufficient documentation

## 2018-11-20 DIAGNOSIS — R2681 Unsteadiness on feet: Secondary | ICD-10-CM | POA: Diagnosis not present

## 2018-11-20 DIAGNOSIS — F0390 Unspecified dementia without behavioral disturbance: Secondary | ICD-10-CM | POA: Diagnosis present

## 2018-11-20 DIAGNOSIS — I639 Cerebral infarction, unspecified: Secondary | ICD-10-CM | POA: Diagnosis not present

## 2018-11-20 DIAGNOSIS — E119 Type 2 diabetes mellitus without complications: Secondary | ICD-10-CM | POA: Diagnosis not present

## 2018-11-20 DIAGNOSIS — R9401 Abnormal electroencephalogram [EEG]: Secondary | ICD-10-CM | POA: Insufficient documentation

## 2018-11-20 DIAGNOSIS — E118 Type 2 diabetes mellitus with unspecified complications: Secondary | ICD-10-CM

## 2018-11-20 DIAGNOSIS — Z7984 Long term (current) use of oral hypoglycemic drugs: Secondary | ICD-10-CM | POA: Insufficient documentation

## 2018-11-20 DIAGNOSIS — E872 Acidosis: Secondary | ICD-10-CM | POA: Insufficient documentation

## 2018-11-20 DIAGNOSIS — Z79899 Other long term (current) drug therapy: Secondary | ICD-10-CM | POA: Insufficient documentation

## 2018-11-20 DIAGNOSIS — R531 Weakness: Secondary | ICD-10-CM | POA: Diagnosis not present

## 2018-11-20 DIAGNOSIS — I10 Essential (primary) hypertension: Secondary | ICD-10-CM | POA: Diagnosis present

## 2018-11-20 DIAGNOSIS — Z885 Allergy status to narcotic agent status: Secondary | ICD-10-CM | POA: Diagnosis not present

## 2018-11-20 DIAGNOSIS — Z833 Family history of diabetes mellitus: Secondary | ICD-10-CM | POA: Diagnosis not present

## 2018-11-20 DIAGNOSIS — I2510 Atherosclerotic heart disease of native coronary artery without angina pectoris: Secondary | ICD-10-CM | POA: Diagnosis not present

## 2018-11-20 DIAGNOSIS — R296 Repeated falls: Secondary | ICD-10-CM | POA: Diagnosis not present

## 2018-11-20 DIAGNOSIS — K589 Irritable bowel syndrome without diarrhea: Secondary | ICD-10-CM | POA: Insufficient documentation

## 2018-11-20 DIAGNOSIS — I252 Old myocardial infarction: Secondary | ICD-10-CM | POA: Insufficient documentation

## 2018-11-20 DIAGNOSIS — N179 Acute kidney failure, unspecified: Secondary | ICD-10-CM | POA: Diagnosis not present

## 2018-11-20 DIAGNOSIS — Z7982 Long term (current) use of aspirin: Secondary | ICD-10-CM | POA: Insufficient documentation

## 2018-11-20 DIAGNOSIS — R509 Fever, unspecified: Secondary | ICD-10-CM | POA: Diagnosis not present

## 2018-11-20 LAB — CBC WITH DIFFERENTIAL/PLATELET
Abs Immature Granulocytes: 0.04 10*3/uL (ref 0.00–0.07)
Basophils Absolute: 0 10*3/uL (ref 0.0–0.1)
Basophils Relative: 0 %
EOS PCT: 0 %
Eosinophils Absolute: 0 10*3/uL (ref 0.0–0.5)
HCT: 46.8 % (ref 39.0–52.0)
Hemoglobin: 15.1 g/dL (ref 13.0–17.0)
Immature Granulocytes: 0 %
LYMPHS PCT: 12 %
Lymphs Abs: 1.3 10*3/uL (ref 0.7–4.0)
MCH: 30 pg (ref 26.0–34.0)
MCHC: 32.3 g/dL (ref 30.0–36.0)
MCV: 92.9 fL (ref 80.0–100.0)
Monocytes Absolute: 0.6 10*3/uL (ref 0.1–1.0)
Monocytes Relative: 6 %
Neutro Abs: 8.5 10*3/uL — ABNORMAL HIGH (ref 1.7–7.7)
Neutrophils Relative %: 82 %
Platelets: 318 10*3/uL (ref 150–400)
RBC: 5.04 MIL/uL (ref 4.22–5.81)
RDW: 12.8 % (ref 11.5–15.5)
WBC: 10.5 10*3/uL (ref 4.0–10.5)
nRBC: 0 % (ref 0.0–0.2)

## 2018-11-20 LAB — URINALYSIS, ROUTINE W REFLEX MICROSCOPIC
BILIRUBIN URINE: NEGATIVE
Glucose, UA: NEGATIVE mg/dL
Hgb urine dipstick: NEGATIVE
Ketones, ur: NEGATIVE mg/dL
Leukocytes, UA: NEGATIVE
Nitrite: NEGATIVE
Protein, ur: NEGATIVE mg/dL
Specific Gravity, Urine: 1.019 (ref 1.005–1.030)
pH: 5 (ref 5.0–8.0)

## 2018-11-20 LAB — TSH: TSH: 1.236 u[IU]/mL (ref 0.350–4.500)

## 2018-11-20 LAB — LACTIC ACID, PLASMA
LACTIC ACID, VENOUS: 2.5 mmol/L — AB (ref 0.5–1.9)
Lactic Acid, Venous: 3.1 mmol/L (ref 0.5–1.9)

## 2018-11-20 LAB — COMPREHENSIVE METABOLIC PANEL
ALT: 18 U/L (ref 0–44)
AST: 25 U/L (ref 15–41)
Albumin: 4.1 g/dL (ref 3.5–5.0)
Alkaline Phosphatase: 29 U/L — ABNORMAL LOW (ref 38–126)
Anion gap: 14 (ref 5–15)
BUN: 16 mg/dL (ref 8–23)
CO2: 23 mmol/L (ref 22–32)
Calcium: 10 mg/dL (ref 8.9–10.3)
Chloride: 101 mmol/L (ref 98–111)
Creatinine, Ser: 1.52 mg/dL — ABNORMAL HIGH (ref 0.61–1.24)
GFR calc Af Amer: 53 mL/min — ABNORMAL LOW (ref >60)
GFR calc non Af Amer: 45 mL/min — ABNORMAL LOW (ref >60)
Glucose, Bld: 85 mg/dL (ref 70–99)
Potassium: 3.8 mmol/L (ref 3.5–5.1)
Sodium: 138 mmol/L (ref 135–145)
Total Bilirubin: 1 mg/dL (ref 0.3–1.2)
Total Protein: 7.7 g/dL (ref 6.5–8.1)

## 2018-11-20 LAB — PROTIME-INR
INR: 0.99
Prothrombin Time: 13 seconds (ref 11.4–15.2)

## 2018-11-20 LAB — CBG MONITORING, ED
Glucose-Capillary: 76 mg/dL (ref 70–99)
Glucose-Capillary: 98 mg/dL (ref 70–99)

## 2018-11-20 LAB — VITAMIN B12: Vitamin B-12: 194 pg/mL (ref 180–914)

## 2018-11-20 LAB — ETHANOL: Alcohol, Ethyl (B): <10 mg/dL (ref <10)

## 2018-11-20 LAB — INFLUENZA PANEL BY PCR (TYPE A & B)
INFLBPCR: NEGATIVE
Influenza A By PCR: NEGATIVE

## 2018-11-20 LAB — I-STAT TROPONIN, ED: TROPONIN I, POC: 0.01 ng/mL (ref 0.00–0.08)

## 2018-11-20 LAB — APTT: aPTT: 28 seconds (ref 24–36)

## 2018-11-20 LAB — GLUCOSE, CAPILLARY: GLUCOSE-CAPILLARY: 144 mg/dL — AB (ref 70–99)

## 2018-11-20 MED ORDER — LISINOPRIL 5 MG PO TABS
2.5000 mg | ORAL_TABLET | Freq: Every day | ORAL | Status: DC
  Administered 2018-11-21: 2.5 mg via ORAL
  Filled 2018-11-20 (×2): qty 1

## 2018-11-20 MED ORDER — ONDANSETRON HCL 4 MG PO TABS: 4.0000 mg | ORAL_TABLET | Freq: Four times a day (QID) | ORAL | Status: DC | PRN

## 2018-11-20 MED ORDER — ASPIRIN EC 81 MG PO TBEC
81.0000 mg | DELAYED_RELEASE_TABLET | Freq: Every day | ORAL | Status: DC
  Administered 2018-11-21: 81 mg via ORAL
  Filled 2018-11-20 (×2): qty 1

## 2018-11-20 MED ORDER — OMEGA-3-ACID ETHYL ESTERS 1 G PO CAPS
2.0000 g | ORAL_CAPSULE | Freq: Three times a day (TID) | ORAL | Status: DC
  Administered 2018-11-20 – 2018-11-21 (×3): 2 g via ORAL
  Filled 2018-11-20 (×3): qty 2

## 2018-11-20 MED ORDER — ALBUTEROL SULFATE (2.5 MG/3ML) 0.083% IN NEBU: 3.0000 mL | INHALATION_SOLUTION | Freq: Four times a day (QID) | RESPIRATORY_TRACT | Status: DC | PRN

## 2018-11-20 MED ORDER — ACETAMINOPHEN 650 MG RE SUPP: 650.0000 mg | Freq: Four times a day (QID) | RECTAL | Status: DC | PRN

## 2018-11-20 MED ORDER — ACETAMINOPHEN 325 MG PO TABS: 650.0000 mg | ORAL_TABLET | Freq: Four times a day (QID) | ORAL | Status: DC | PRN

## 2018-11-20 MED ORDER — LOSARTAN POTASSIUM 50 MG PO TABS
25.0000 mg | ORAL_TABLET | Freq: Every day | ORAL | Status: DC
  Administered 2018-11-21: 25 mg via ORAL
  Filled 2018-11-20 (×2): qty 1

## 2018-11-20 MED ORDER — ATORVASTATIN CALCIUM 40 MG PO TABS
40.0000 mg | ORAL_TABLET | Freq: Every day | ORAL | Status: DC
  Administered 2018-11-21: 40 mg via ORAL
  Filled 2018-11-20 (×2): qty 1

## 2018-11-20 MED ORDER — SODIUM CHLORIDE 0.9 % IV BOLUS
1000.0000 mL | Freq: Once | INTRAVENOUS | Status: AC
  Administered 2018-11-20: 1000 mL via INTRAVENOUS

## 2018-11-20 MED ORDER — HYDROCHLOROTHIAZIDE 25 MG PO TABS
25.0000 mg | ORAL_TABLET | Freq: Every day | ORAL | Status: DC
  Administered 2018-11-21: 25 mg via ORAL
  Filled 2018-11-20 (×2): qty 1

## 2018-11-20 MED ORDER — NITROGLYCERIN 0.4 MG SL SUBL: 0.4000 mg | SUBLINGUAL_TABLET | SUBLINGUAL | Status: DC | PRN

## 2018-11-20 MED ORDER — DIVALPROEX SODIUM ER 250 MG PO TB24
250.0000 mg | ORAL_TABLET | Freq: Every day | ORAL | Status: DC
  Administered 2018-11-21: 250 mg via ORAL
  Filled 2018-11-20 (×2): qty 1

## 2018-11-20 MED ORDER — SODIUM CHLORIDE 0.45 % IV SOLN
INTRAVENOUS | Status: DC
  Administered 2018-11-20 – 2018-11-21 (×2): via INTRAVENOUS

## 2018-11-20 MED ORDER — ONDANSETRON HCL 4 MG/2ML IJ SOLN: 4.0000 mg | Freq: Four times a day (QID) | INTRAMUSCULAR | Status: DC | PRN

## 2018-11-20 MED ORDER — OMEGA-3 FATTY ACIDS 1000 MG PO CAPS: 2.0000 g | ORAL_CAPSULE | Freq: Three times a day (TID) | ORAL | Status: DC

## 2018-11-20 MED ORDER — ENOXAPARIN SODIUM 40 MG/0.4ML ~~LOC~~ SOLN
40.0000 mg | SUBCUTANEOUS | Status: DC
  Administered 2018-11-20: 40 mg via SUBCUTANEOUS
  Filled 2018-11-20: qty 0.4

## 2018-11-20 MED ORDER — SODIUM CHLORIDE 0.9% FLUSH
3.0000 mL | Freq: Once | INTRAVENOUS | Status: AC
  Administered 2018-11-20: 3 mL via INTRAVENOUS

## 2018-11-20 MED ORDER — AMLODIPINE BESYLATE 5 MG PO TABS
5.0000 mg | ORAL_TABLET | Freq: Every day | ORAL | Status: DC
  Administered 2018-11-21: 5 mg via ORAL
  Filled 2018-11-20 (×2): qty 1

## 2018-11-20 MED ORDER — MEMANTINE HCL 5 MG PO TABS
5.0000 mg | ORAL_TABLET | Freq: Two times a day (BID) | ORAL | Status: DC
  Administered 2018-11-20: 5 mg via ORAL
  Filled 2018-11-20 (×2): qty 1

## 2018-11-20 MED ORDER — DEXTROSE 50 % IV SOLN
1.0000 | Freq: Once | INTRAVENOUS | Status: AC
  Administered 2018-11-20: 50 mL via INTRAVENOUS
  Filled 2018-11-20: qty 50

## 2018-11-20 MED ORDER — FENOFIBRATE 54 MG PO TABS
54.0000 mg | ORAL_TABLET | Freq: Every day | ORAL | Status: DC
  Administered 2018-11-21: 54 mg via ORAL
  Filled 2018-11-20 (×2): qty 1

## 2018-11-20 MED ORDER — MELATONIN 3 MG PO TABS: 3.0000 mg | ORAL_TABLET | Freq: Every evening | ORAL | Status: DC

## 2018-11-20 MED ORDER — HYDROCODONE-ACETAMINOPHEN 5-325 MG PO TABS: 1.0000 | ORAL_TABLET | Freq: Four times a day (QID) | ORAL | Status: DC | PRN

## 2018-11-20 NOTE — ED Triage Notes (Signed)
Pt leaning to right, started this am, fell at approx 3;30am, assisted into bed per wife-- this am at 8am, pt was unable to stand, right arm weaker.

## 2018-11-20 NOTE — Telephone Encounter (Signed)
Patient's wife left vm stating she needs to speak with you ASAP about her husband. She said something is wrong. Please call her back at [phone]. Thanks!

## 2018-11-20 NOTE — ED Provider Notes (Signed)
[facility] EMERGENCY DEPARTMENT Provider Note   CSN: [id number] Arrival date & time: 11/20/18  1219     History   Chief Complaint Chief Complaint  Patient presents with  . Stroke Symptoms  . Fever    HPI Dustin [name] is a 73 y.o. male.  The history is provided by the spouse and medical records. No language interpreter was used.  Fever     73 year old male with history of diabetes, CAD, GERD, mild dementia brought in by wife for evaluation of potential stroke.  Per wife, patient was less active yesterday and slept for most of the day.  This a.m. she found him on his knees on his way to the bathroom.  She was able to guide him to the bathroom to urinate and back to his bed but this a.m. he was unable to walk.  Patient normally does use a cane with assistance to move about but he was unable to ambulate.  Wife did not report of any recent sickness, no URI symptoms, no complaints of headache, chest pain, abdominal pain, nausea vomiting diarrhea or urinary symptoms. Level V caveats applied  Past Medical History:  Diagnosis Date  . Abdominal hernia   . Coronary atherosclerosis of native coronary artery 2003   a. NSTEMI s/p BMS to distal circumflex and PDA 2003. b. Cath 2007: patent stents with moderate mid to distal LAD disease.  . Diabetes mellitus, type 2 ([place])   . Diastolic dysfunction    Grade 2. EF 55-60%  . Diverticulosis   . Essential hypertension, benign   . GERD (gastroesophageal reflux disease)   . Hyperlipidemia   . Irritable bowel syndrome   . Nephrolithiasis   . NSTEMI (non-ST elevated myocardial infarction) [facility])     Patient Active Problem List   Diagnosis Date Noted  . Mild dementia ([place]) 08/01/2016  . Mild cognitive impairment 11/14/2015  . Anxiety state 11/14/2015  . GERD (gastroesophageal reflux disease) 06/11/2014  . Coronary atherosclerosis of native coronary artery   . Hyperlipidemia   . Diabetes mellitus, controlled ([place])   .  Essential hypertension, benign   . Obstructive sleep apnea     Past Surgical History:  Procedure Laterality Date  . COLONOSCOPY  2011   Diverticulosis  . CORONARY STENT PLACEMENT    . LITHOTRIPSY          Home Medications    Prior to Admission medications   Medication Sig Start Date End Date Taking? Authorizing Provider  amLODipine (NORVASC) 10 MG tablet  05/08/18   [provider]  amLODipine (NORVASC) 5 MG tablet Take 5 mg by mouth daily.    [provider]  aspirin EC 81 MG tablet Take 81 mg by mouth daily.    [provider]  atorvastatin (LIPITOR) 40 MG tablet Take 40 mg by mouth daily.    [provider]  divalproex (DEPAKOTE ER) 250 MG 24 hr tablet Take 1 tablet every night 08/11/18   Cameron Sprang, MD  erythromycin ophthalmic ointment  02/21/18   [provider]  escitalopram (LEXAPRO) 10 MG tablet Take 2 tablets every day 08/11/18   Cameron Sprang, MD  fenofibrate (TRICOR) 145 MG tablet Take 1 tablet by mouth daily. 05/17/14   [provider]  fish oil-omega-3 fatty acids 1000 MG capsule Take 2 g by mouth 3 (three) times daily.     [provider]  glipiZIDE (GLUCOTROL) 10 MG tablet Take 1 tablet by mouth daily. 05/17/14  [provider]  hydrochlorothiazide (HYDRODIURIL) 25 MG tablet  03/19/18   [provider]  HYDROcodone-acetaminophen (NORCO/VICODIN) 5-325 MG tablet Take 1 tablet by mouth every 6 (six) hours as needed.  09/02/15   [provider]  levofloxacin (LEVAQUIN) 500 MG tablet  05/01/18   [provider]  lisinopril (PRINIVIL,ZESTRIL) 2.5 MG tablet  01/07/18   [provider]  losartan (COZAAR) 25 MG tablet  05/02/18   [provider]  Melatonin 3 MG CAPS Take 2 capsules every night 01/21/18   Cameron Sprang, MD  memantine [facility]) 5 MG tablet  03/05/18   [provider]  metFORMIN (GLUCOPHAGE) 500 MG tablet Take 2 tablets by mouth 2 (two)  times daily. 06/02/14   [provider]  nitroGLYCERIN (NITROSTAT) 0.4 MG SL tablet Place 1 tablet (0.4 mg total) under the tongue every 5 (five) minutes as needed for chest pain. 07/28/14   Satira Sark, MD  [address] [phone] Base) MCG/ACT AEPB 2 puffs once daily 11/07/15   [provider]  zolpidem (AMBIEN) 5 MG tablet take 1 tablet by mouth at bedtime for sleep as needed 09/02/15   [provider]    Family History Family History  Problem Relation Age of Onset  . Heart attack Mother   . Hyperlipidemia Mother   . Diabetes Mother   . Heart attack Father   . Hyperlipidemia Father   . Heart attack Sister   . Hyperlipidemia Sister     Social History Social History   Tobacco Use  . Smoking status: Never Smoker  . Smokeless tobacco: Never Used  Substance Use Topics  . Alcohol use: No    Alcohol/week: 0.0 standard drinks  . Drug use: No     Allergies   Morphine and related and Ziac [bisoprolol-hydrochlorothiazide]   Review of Systems Review of Systems  Unable to perform ROS: Dementia  Constitutional: Positive for fever.     Physical Exam Updated Vital Signs BP (!) 140/91   Pulse 91   Temp (S) 99.9 F (37.7 C) (Oral)   Resp 16   Ht 5\' 8"  (1.727 m)   Wt 81.6 kg   SpO2 94%   BMI 27.37 kg/m   Physical Exam Vitals signs and nursing note reviewed.  Constitutional:      General: He is not in acute distress.    Appearance: He is well-developed.  HENT:     Head: Normocephalic and atraumatic.     Nose: Nose normal.     Mouth/Throat:     Mouth: Mucous membranes are moist.  Eyes:     Conjunctiva/sclera: Conjunctivae normal.  Neck:     Musculoskeletal: Normal range of motion and neck supple. No neck rigidity.  Cardiovascular:     Rate and Rhythm: Normal rate and regular rhythm.     Heart sounds: Normal heart sounds.  Pulmonary:     Breath sounds: No wheezing or rhonchi.  Abdominal:     Palpations: Abdomen is soft.      Tenderness: There is no abdominal tenderness.  Skin:    Findings: No rash.  Neurological:     Mental Status: He is alert.     Comments: Neurologic exam:  Speech truncated, pupils equal round reactive to light, extraocular movements intact  Cranial nerves III through XII grossly intact Follows some commands, unable to grip or raise R arm/leg compare to left Coordination and sensation unable to test R pronator drift Gait not tested  Alert to self.  ED Treatments / Results  Labs (all labs ordered are listed, but only abnormal results are displayed) Labs Reviewed  LACTIC ACID, PLASMA - Abnormal; Notable for the following components:      Result Value   Lactic Acid, Venous 3.1 (*)    All other components within normal limits  COMPREHENSIVE METABOLIC PANEL - Abnormal; Notable for the following components:   Creatinine, Ser 1.52 (*)    Alkaline Phosphatase 29 (*)    GFR calc non Af Amer 45 (*)    GFR calc Af Amer 53 (*)    All other components within normal limits  CBC WITH DIFFERENTIAL/PLATELET - Abnormal; Notable for the following components:   Neutro Abs 8.5 (*)    All other components within normal limits  CULTURE, BLOOD (ROUTINE X 2)  CULTURE, BLOOD (ROUTINE X 2)  URINALYSIS, ROUTINE W REFLEX MICROSCOPIC  PROTIME-INR  APTT  ETHANOL  INFLUENZA PANEL BY PCR (TYPE A & B)  LACTIC ACID, PLASMA  RAPID URINE DRUG SCREEN, HOSP PERFORMED  CBG MONITORING, ED  I-STAT TROPONIN, ED  CBG MONITORING, ED    EKG None  Radiology Dg Chest 2 View  Result Date: 11/20/2018 CLINICAL DATA:  Chest pain, shortness of breath. EXAM: CHEST - 2 VIEW COMPARISON:  Radiograph of June 10, 2014. FINDINGS: Stable cardiomegaly. No pneumothorax or pleural effusion is noted. Both lungs are clear. The visualized skeletal structures are unremarkable. IMPRESSION: No active cardiopulmonary disease. Electronically Signed   By: Marijo Conception, M.D.   On: 11/20/2018 13:28   Mr Jodene Nam Head Wo  Contrast  Result Date: 11/20/2018 CLINICAL DATA:  Right-sided weakness.  Aphasia. EXAM: MRI HEAD WITHOUT CONTRAST MRA HEAD WITHOUT CONTRAST TECHNIQUE: Multiplanar, multiecho pulse sequences of the brain and surrounding structures were obtained without intravenous contrast. Angiographic images of the head were obtained using MRA technique without contrast. COMPARISON:  Head CT 08/07/2016 FINDINGS: MRI HEAD FINDINGS The study is motion degraded throughout with some sequences being severely degraded. Brain: No acute infarct is identified although motion artifact reduces sensitivity for detection of very small infarcts. Scattered chronic microhemorrhages are noted in both cerebral hemispheres. No mass, midline shift, or extra-axial fluid collection is identified. Small chronic infarcts are noted in the right occipital lobe and right cerebellum. Patchy T2 hyperintensities in the cerebral white matter bilaterally are nonspecific but compatible with mild-to-moderate chronic small vessel ischemic disease. There is moderate cerebral atrophy. Vascular: Major intracranial vascular flow voids are preserved. Skull and upper cervical spine: No destructive skull lesion. Sinuses/Orbits: Unremarkable orbits. Trace left mastoid fluid. Clear paranasal sinuses. Other: None. MRA HEAD FINDINGS The study is moderately motion degraded. The visualized distal vertebral arteries are patent with the left being strongly dominant and supplying the basilar artery. The right vertebral artery ends in PICA. The basilar artery is widely patent. The PCAs are patent with asymmetric attenuation of the right PCA and evidence of a severe proximal to mid right P2 stenosis. There may be moderate to severe distal P1 stenoses bilaterally versus artifact. The internal carotid arteries are patent from skull base to carotid termini without evidence of significant stenosis within limitations of motion. ACAs and MCAs are patent proximally with the left A1  segment appearing hypoplastic. The right A1 segment is widely patent. Both M1 segments also appear widely patent although there is artifactual signal loss at both MCA bifurcations. ACA and MCA branch vessels are not well evaluated due to motion. No aneurysm is identified. IMPRESSION: 1. Motion degraded examination without evidence of acute  intracranial abnormality. 2. Mild-to-moderate chronic small vessel ischemic disease. 3. 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 4. Motion degraded head MRA without evidence of large vessel occlusion. 5. Severe right P2 stenosis. Possible moderate to severe bilateral P1 stenoses. Electronically Signed   By: Logan Bores M.D.   On: 11/20/2018 16:00   Mr Brain Wo Contrast  Result Date: 11/20/2018 CLINICAL DATA:  Right-sided weakness.  Aphasia. EXAM: MRI HEAD WITHOUT CONTRAST MRA HEAD WITHOUT CONTRAST TECHNIQUE: Multiplanar, multiecho pulse sequences of the brain and surrounding structures were obtained without intravenous contrast. Angiographic images of the head were obtained using MRA technique without contrast. COMPARISON:  Head CT 08/07/2016 FINDINGS: MRI HEAD FINDINGS The study is motion degraded throughout with some sequences being severely degraded. Brain: No acute infarct is identified although motion artifact reduces sensitivity for detection of very small infarcts. Scattered chronic microhemorrhages are noted in both cerebral hemispheres. No mass, midline shift, or extra-axial fluid collection is identified. Small chronic infarcts are noted in the right occipital lobe and right cerebellum. Patchy T2 hyperintensities in the cerebral white matter bilaterally are nonspecific but compatible with mild-to-moderate chronic small vessel ischemic disease. There is moderate cerebral atrophy. Vascular: Major intracranial vascular flow voids are preserved. Skull and upper cervical spine: No destructive skull lesion. Sinuses/Orbits: Unremarkable orbits. Trace left  mastoid fluid. Clear paranasal sinuses. Other: None. MRA HEAD FINDINGS The study is moderately motion degraded. The visualized distal vertebral arteries are patent with the left being strongly dominant and supplying the basilar artery. The right vertebral artery ends in PICA. The basilar artery is widely patent. The PCAs are patent with asymmetric attenuation of the right PCA and evidence of a severe proximal to mid right P2 stenosis. There may be moderate to severe distal P1 stenoses bilaterally versus artifact. The internal carotid arteries are patent from skull base to carotid termini without evidence of significant stenosis within limitations of motion. ACAs and MCAs are patent proximally with the left A1 segment appearing hypoplastic. The right A1 segment is widely patent. Both M1 segments also appear widely patent although there is artifactual signal loss at both MCA bifurcations. ACA and MCA branch vessels are not well evaluated due to motion. No aneurysm is identified. IMPRESSION: 1. Motion degraded examination without evidence of acute intracranial abnormality. 2. Mild-to-moderate chronic small vessel ischemic disease. 3. 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 4. Motion degraded head MRA without evidence of large vessel occlusion. 5. Severe right P2 stenosis. Possible moderate to severe bilateral P1 stenoses. Electronically Signed   By: Logan Bores M.D.   On: 11/20/2018 16:00    Procedures Procedures (including critical care time)  Medications Ordered in ED Medications  sodium chloride flush (NS) 0.9 % injection 3 mL (3 mLs Intravenous Given 11/20/18 1333)  dextrose 50 % solution 50 mL (50 mLs Intravenous Given 11/20/18 1351)  sodium chloride 0.9 % bolus 1,000 mL (1,000 mLs Intravenous New Bag/Given 11/20/18 1408)     Initial Impression / Assessment and Plan / ED Course  I have reviewed the triage vital signs and the nursing notes.  Pertinent labs & imaging results that were  available during my care of the patient were reviewed by me and considered in my medical decision making (see chart for details).     BP 131/85   Pulse 88   Temp 100.2 F (37.9 C) (Rectal)   Resp 20   Ht 5\' 8"  (1.727 m)   Wt 81.6 kg   SpO2 95%   BMI  27.37 kg/m    Final Clinical Impressions(s) / ED Diagnoses   Final diagnoses:  Viral illness  Stroke-like symptoms  Weakness    ED Discharge Orders    None     1:07 PM Elderly male, dementia, here with right-sided weakness, last known normal yesterday.  Initial oral temperature nine 9.9, will check rectal temp, no nuchal rigidity to suggest meningitis.  Blood pressure and heart rate stable. Care discussed with Dr. Francia Greaves and with neurologist.   1:11 PM CBG 76, will give an amp of D50.  Rectal temp is 100.2, not tachypnea fever.  Patient is not hypotensive and not tachycardic but he is mildly tachypneic.  He does have an elevated lactic acid of 3.1 as well as stroke symptoms.  I discussed this with Dr. Rudene Christians and with neurologist.  Suspect elevated lactic acid is not due to sepsis.  Normal WBC.  Chest x-ray unremarkable.  Head CT scan ordered.  IV fluid given.  2:52 PM Neurologist, Dr. Malen Gauze has evaluated patient.  Does not identify any focal neuro deficit on his exam.  Plan to obtain brain MRI and MRA and if negative no further stroke work-up required.  Patient then can be diagnosed with metabolic encephalopathy and can be admitted for further work-up of infectious symptoms.  4:25 PM Brain MRI and MRA without acute finding even though there is some motion degradation of the images.  Normal flu test, normal chest x-ray and normal UA.  However in the setting of metabolic encephalopathy, generalized weakness, and fall with prior symptoms, patient would benefit from observation admission.  Will consult for further care.  4:33 PM Appreciate consultation from [facility] Dr. Laren Everts who agrees to see and admit pt for further care.  Pt and family members are aware of plan.     Dustin Moras, PA-C 11/20/18 1635    Valarie Merino, MD 11/21/18 [phone]

## 2018-11-20 NOTE — H&P (Signed)
[facility]                                                                                    Patient Demographics  Dustin [name], is a 73 y.o. male  CSN: [id number]  MRN: [id number]  DOB - [birth year]  Admit Date - 11/20/2018  Outpatient Primary MD for the patient is Redmond School, MD   With History of -  Past Medical History:  Diagnosis Date  . Abdominal hernia   . Coronary atherosclerosis of native coronary artery 2003   a. NSTEMI s/p BMS to distal circumflex and PDA 2003. b. Cath 2007: patent stents with moderate mid to distal LAD disease.  . Diabetes mellitus, type 2 ([place])   . Diastolic dysfunction    Grade 2. EF 55-60%  . Diverticulosis   . Essential hypertension, benign   . GERD (gastroesophageal reflux disease)   . Hyperlipidemia   . Irritable bowel syndrome   . Nephrolithiasis   . NSTEMI (non-ST elevated myocardial infarction) [facility])       Past Surgical History:  Procedure Laterality Date  . COLONOSCOPY  2011   Diverticulosis  . CORONARY STENT PLACEMENT    . LITHOTRIPSY      in for   Chief Complaint  Patient presents with  . Stroke Symptoms  . Fever     HPI  Dustin [name]  is a 73 y.o. male, with past medical history significant for coronary artery disease status post and STEMI, history of hypertension, diabetes and dementia brought to the emergency room today because of falls and right-sided weakness originally.  He was more confused than usual and according to the records he had a fall today while trying to go to the bathroom.  Neurology was consulted because of right-sided weakness which did not persist during his stay in the emergency room.  He was noted to have a low-grade fever and work-up was basically negative mild leukocytosis and negative flu.  The rest of the work-up is basically negative as well except for mild renal insufficiencyIII .  The son reports slurring of speech earlier but according to the son he is back to his baseline  at this time.  Work-up including MRI of the brain, urinalysis, toxicology were all negative and could not explain the worsening symptoms.  I was called to admit for further management. The patient lives at home with his wife who usually helps with his ADLs.    Review of Systems    In addition to the HPI above, No Headache, No changes with Vision or hearing, No problems swallowing food or Liquids, No Chest pain, Cough or Shortness of Breath, No Abdominal pain, No Nausea or Vommitting, Bowel movements are regular, No Blood in stool or Urine, No dysuria, No new skin rashes or bruises, No new joints pains-aches,  No new weakness, tingling, numbness in any extremity, No recent weight gain or loss, No polyuria, polydypsia or polyphagia, No significant Mental Stressors.  A full 10 point Review of Systems was done, except as stated above, all other Review of Systems were negative.   Social History Social History   Tobacco Use  . Smoking status: Never  Smoker  . Smokeless tobacco: Never Used  Substance Use Topics  . Alcohol use: No    Alcohol/week: 0.0 standard drinks     Family History Family History  Problem Relation Age of Onset  . Heart attack Mother   . Hyperlipidemia Mother   . Diabetes Mother   . Heart attack Father   . Hyperlipidemia Father   . Heart attack Sister   . Hyperlipidemia Sister      Prior to Admission medications   Medication Sig Start Date End Date Taking? Authorizing Provider  amLODipine (NORVASC) 10 MG tablet  05/08/18   [provider]  amLODipine (NORVASC) 5 MG tablet Take 5 mg by mouth daily.    [provider]  aspirin EC 81 MG tablet Take 81 mg by mouth daily.    [provider]  atorvastatin (LIPITOR) 40 MG tablet Take 40 mg by mouth daily.    [provider]  divalproex (DEPAKOTE ER) 250 MG 24 hr tablet Take 1 tablet every night 08/11/18   Cameron Sprang, MD  erythromycin ophthalmic ointment  02/21/18    [provider]  escitalopram (LEXAPRO) 10 MG tablet Take 2 tablets every day 08/11/18   Cameron Sprang, MD  fenofibrate (TRICOR) 145 MG tablet Take 1 tablet by mouth daily. 05/17/14   [provider]  fish oil-omega-3 fatty acids 1000 MG capsule Take 2 g by mouth 3 (three) times daily.     [provider]  glipiZIDE (GLUCOTROL) 10 MG tablet Take 1 tablet by mouth daily. 05/17/14   [provider]  hydrochlorothiazide (HYDRODIURIL) 25 MG tablet  03/19/18   [provider]  HYDROcodone-acetaminophen (NORCO/VICODIN) 5-325 MG tablet Take 1 tablet by mouth every 6 (six) hours as needed.  09/02/15   [provider]  levofloxacin (LEVAQUIN) 500 MG tablet  05/01/18   [provider]  lisinopril (PRINIVIL,ZESTRIL) 2.5 MG tablet  01/07/18   [provider]  losartan (COZAAR) 25 MG tablet  05/02/18   [provider]  Melatonin 3 MG CAPS Take 2 capsules every night 01/21/18   Cameron Sprang, MD  memantine [facility]) 5 MG tablet  03/05/18   [provider]  metFORMIN (GLUCOPHAGE) 500 MG tablet Take 2 tablets by mouth 2 (two) times daily. 06/02/14   [provider]  nitroGLYCERIN (NITROSTAT) 0.4 MG SL tablet Place 1 tablet (0.4 mg total) under the tongue every 5 (five) minutes as needed for chest pain. 07/28/14   Satira Sark, MD  PROAIR [address] [phone] Base) MCG/ACT AEPB 2 puffs once daily 11/07/15   [provider]  zolpidem (AMBIEN) 5 MG tablet take 1 tablet by mouth at bedtime for sleep as needed 09/02/15   [provider]    Allergies  Allergen Reactions  . Morphine And Related Nausea And Vomiting  . Ziac [Bisoprolol-Hydrochlorothiazide]     Unknown reaction    Physical Exam  Vitals  Blood pressure 131/85, pulse 88, temperature 100.2 F (37.9 C), temperature source Rectal, resp. rate 20, height 5\' 8"  (1.727 m), weight 81.6 kg, SpO2 95 %.   1. General well-developed elderly male,  no acute distress  2. Normal affect and insight, Not Suicidal or Homicidal, pleasantly confused.  3. No F.N deficits, grossly, patient moving all extremities and motor power is normal and symmetrical.  4. Ears and Eyes appear Normal, Conjunctivae clear, PERRLA. Moist Oral Mucosa.  5. Supple Neck, No JVD, No cervical lymphadenopathy appriciated, No Carotid Bruits.  6.  Symmetrical Chest wall movement, Good air movement bilaterally, CTAB.  7. RRR, No Gallops, Rubs or Murmurs, No Parasternal Heave.  8. Positive Bowel Sounds, Abdomen Soft, Non tender, No organomegaly appriciated,No rebound -guarding or rigidity.  9.  No Cyanosis, Normal Skin Turgor, No Skin Rash or Bruise.  10. Good muscle tone,  joints appear normal , no effusions, Normal ROM.    Data Review  CBC Recent Labs  Lab 11/20/18 1237  WBC 10.5  HGB 15.1  HCT 46.8  PLT 318  MCV 92.9  MCH 30.0  MCHC 32.3  RDW 12.8  LYMPHSABS 1.3  MONOABS 0.6  EOSABS 0.0  BASOSABS 0.0   ------------------------------------------------------------------------------------------------------------------  Chemistries  Recent Labs  Lab 11/20/18 1237  NA 138  K 3.8  CL 101  CO2 23  GLUCOSE 85  BUN 16  CREATININE 1.52*  CALCIUM 10.0  AST 25  ALT 18  ALKPHOS 29*  BILITOT 1.0   ------------------------------------------------------------------------------------------------------------------ estimated creatinine clearance is 43.1 mL/min (A) (by C-G formula based on SCr of 1.52 mg/dL (H)). ------------------------------------------------------------------------------------------------------------------ No results for input(s): TSH, T4TOTAL, T3FREE, THYROIDAB in the last 72 hours.  Invalid input(s): FREET3   Coagulation profile Recent Labs  Lab 11/20/18 1259  INR 0.99   ------------------------------------------------------------------------------------------------------------------- No results for input(s): DDIMER in  the last 72 hours. -------------------------------------------------------------------------------------------------------------------  Cardiac Enzymes No results for input(s): CKMB, TROPONINI, MYOGLOBIN in the last 168 hours.  Invalid input(s): CK ------------------------------------------------------------------------------------------------------------------ Invalid input(s): POCBNP   ---------------------------------------------------------------------------------------------------------------  Urinalysis    Component Value Date/Time   COLORURINE YELLOW 11/20/2018 [place] 11/20/2018 1230   LABSPEC 1.019 11/20/2018 1230   PHURINE 5.0 11/20/2018 1230   GLUCOSEU NEGATIVE 11/20/2018 1230   HGBUR NEGATIVE 11/20/2018 1230   BILIRUBINUR NEGATIVE 11/20/2018 1230   KETONESUR NEGATIVE 11/20/2018 1230   PROTEINUR NEGATIVE 11/20/2018 1230   NITRITE NEGATIVE 11/20/2018 1230   LEUKOCYTESUR NEGATIVE 11/20/2018 1230    ----------------------------------------------------------------------------------------------------------------   Imaging results:   Dg Chest 2 View  Result Date: 11/20/2018 CLINICAL DATA:  Chest pain, shortness of breath. EXAM: CHEST - 2 VIEW COMPARISON:  Radiograph of June 10, 2014. FINDINGS: Stable cardiomegaly. No pneumothorax or pleural effusion is noted. Both lungs are clear. The visualized skeletal structures are unremarkable. IMPRESSION: No active cardiopulmonary disease. Electronically Signed   By: Marijo Conception, M.D.   On: 11/20/2018 13:28   Mr Jodene Nam Head Wo Contrast  Result Date: 11/20/2018 CLINICAL DATA:  Right-sided weakness.  Aphasia. EXAM: MRI HEAD WITHOUT CONTRAST MRA HEAD WITHOUT CONTRAST TECHNIQUE: Multiplanar, multiecho pulse sequences of the brain and surrounding structures were obtained without intravenous contrast. Angiographic images of the head were obtained using MRA technique without contrast. COMPARISON:  Head CT 08/07/2016  FINDINGS: MRI HEAD FINDINGS The study is motion degraded throughout with some sequences being severely degraded. Brain: No acute infarct is identified although motion artifact reduces sensitivity for detection of very small infarcts. Scattered chronic microhemorrhages are noted in both cerebral hemispheres. No mass, midline shift, or extra-axial fluid collection is identified. Small chronic infarcts are noted in the right occipital lobe and right cerebellum. Patchy T2 hyperintensities in the cerebral white matter bilaterally are nonspecific but compatible with mild-to-moderate chronic small vessel ischemic disease. There is moderate cerebral atrophy. Vascular: Major intracranial vascular flow voids are preserved. Skull and upper cervical spine: No destructive skull lesion. Sinuses/Orbits: Unremarkable orbits. Trace left mastoid fluid. Clear paranasal sinuses. Other: None. MRA HEAD FINDINGS The study is moderately motion degraded. The visualized distal vertebral arteries are patent  with the left being strongly dominant and supplying the basilar artery. The right vertebral artery ends in PICA. The basilar artery is widely patent. The PCAs are patent with asymmetric attenuation of the right PCA and evidence of a severe proximal to mid right P2 stenosis. There may be moderate to severe distal P1 stenoses bilaterally versus artifact. The internal carotid arteries are patent from skull base to carotid termini without evidence of significant stenosis within limitations of motion. ACAs and MCAs are patent proximally with the left A1 segment appearing hypoplastic. The right A1 segment is widely patent. Both M1 segments also appear widely patent although there is artifactual signal loss at both MCA bifurcations. ACA and MCA branch vessels are not well evaluated due to motion. No aneurysm is identified. IMPRESSION: 1. Motion degraded examination without evidence of acute intracranial abnormality. 2. Mild-to-moderate chronic  small vessel ischemic disease. 3. 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 4. Motion degraded head MRA without evidence of large vessel occlusion. 5. Severe right P2 stenosis. Possible moderate to severe bilateral P1 stenoses. Electronically Signed   By: Logan Bores M.D.   On: 11/20/2018 16:00   Mr Brain Wo Contrast  Result Date: 11/20/2018 CLINICAL DATA:  Right-sided weakness.  Aphasia. EXAM: MRI HEAD WITHOUT CONTRAST MRA HEAD WITHOUT CONTRAST TECHNIQUE: Multiplanar, multiecho pulse sequences of the brain and surrounding structures were obtained without intravenous contrast. Angiographic images of the head were obtained using MRA technique without contrast. COMPARISON:  Head CT 08/07/2016 FINDINGS: MRI HEAD FINDINGS The study is motion degraded throughout with some sequences being severely degraded. Brain: No acute infarct is identified although motion artifact reduces sensitivity for detection of very small infarcts. Scattered chronic microhemorrhages are noted in both cerebral hemispheres. No mass, midline shift, or extra-axial fluid collection is identified. Small chronic infarcts are noted in the right occipital lobe and right cerebellum. Patchy T2 hyperintensities in the cerebral white matter bilaterally are nonspecific but compatible with mild-to-moderate chronic small vessel ischemic disease. There is moderate cerebral atrophy. Vascular: Major intracranial vascular flow voids are preserved. Skull and upper cervical spine: No destructive skull lesion. Sinuses/Orbits: Unremarkable orbits. Trace left mastoid fluid. Clear paranasal sinuses. Other: None. MRA HEAD FINDINGS The study is moderately motion degraded. The visualized distal vertebral arteries are patent with the left being strongly dominant and supplying the basilar artery. The right vertebral artery ends in PICA. The basilar artery is widely patent. The PCAs are patent with asymmetric attenuation of the right PCA and evidence of a  severe proximal to mid right P2 stenosis. There may be moderate to severe distal P1 stenoses bilaterally versus artifact. The internal carotid arteries are patent from skull base to carotid termini without evidence of significant stenosis within limitations of motion. ACAs and MCAs are patent proximally with the left A1 segment appearing hypoplastic. The right A1 segment is widely patent. Both M1 segments also appear widely patent although there is artifactual signal loss at both MCA bifurcations. ACA and MCA branch vessels are not well evaluated due to motion. No aneurysm is identified. IMPRESSION: 1. Motion degraded examination without evidence of acute intracranial abnormality. 2. Mild-to-moderate chronic small vessel ischemic disease. 3. 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 4. Motion degraded head MRA without evidence of large vessel occlusion. 5. Severe right P2 stenosis. Possible moderate to severe bilateral P1 stenoses. Electronically Signed   By: Logan Bores M.D.   On: 11/20/2018 16:00      Assessment & Plan  Altered mental status, encephalopathy ,probably  metabolic versus worsening dementia. Focal weakness is resolved Neurochecks Check TSH/vitamin B12 PT evaluation and treatment Neurology input appreciated  History of coronary artery disease status post NSTEMI Continue with Lipitor and ACE inhibitor  History of dementia Continue with Namenda We will hold Lexapro  History of hypertension Continue with losartan lisinopril  Diabetes mellitus type 2 Hold Glucophage due to renal insufficiency ISS  Renal insufficiency Patient is on losartan and lisinopril?  Proteinuria       DVT Prophylaxis Lovenox  AM Labs Ordered, also please review Full Orders  Family Communication: Admission, patients condition and plan of care including tests being ordered have been discussed with the patient and son who indicate understanding and agree with the plan and Code  Status.  Code Status full  Disposition Plan: Home  Time spent in minutes : 51 minutes  Condition GUARDED   @SIGNATURE @

## 2018-11-20 NOTE — Progress Notes (Signed)
CRITICAL VALUE ALERT  Critical Value:  Lactic acid 2.5  Date & Time Notied:  11/20/2018 at 2041  Provider Notified: Arby Barrette NP  Orders Received/Actions taken: yes

## 2018-11-20 NOTE — Telephone Encounter (Signed)
Spoke with pt's wife, Posey Pronto.  She states that starting yesterday pt just stars at the wall and cannot walk.  States that it is worse today.  Pt is also speaking very little.  Posey Pronto states that when she tells pt "I love you" he always responds with "love you too" yet today just hummed in response.  I advised Posey Pronto to take pt to either Urgent Care or ED (she prefers [facility]) for evaluation of possible underlying infection.  Lucille verbalized understanding.  Posey Pronto also states that she has been meaning to call our office to let us know that she has not seen much of a change in pt since starting Depakote.    Dr. Delice Lesch - Jul[facility] Rainier

## 2018-11-20 NOTE — Consult Note (Addendum)
Neurology Consultation  Reason for Consult: Sided weakness possible stroke Referring Physician: Francia Greaves,   CC: Right-sided weakness  History is obtained from: Wife  HPI: Dustin [name] is a 73 y.o. male with history of non-STEMI, irritable bowel syndrome, hyperlipidemia, hypertension, diabetes, dementia.  Per wife patient takes an aspirin every day.  Patient was last seen normal at approximately 2000.  At approximately 4 AM he got up to go to the bathroom and fell.  Wife noted it is very difficult to keep him up and walking to the bathroom.  Upon waking up this morning it was noted that patient had a right facial droop and was weak on his right arm and leg.  Unfortunately patient has significant dementia and cannot give any history.  Family does note that although he has dementia he can talk and hold a conversation at times however at this point time is having significant difficulty understanding and expressing himself.  Patient was brought to the emergency room with concern that he may have had a stroke.  In the ED, he continued to have some right-sided weakness on initial examination and some difficulty with his speech for which a neurological consultation was obtained.   LKW: 2000 hrs. on 11/20/2018 tpa given?: no, out of the window Premorbid modified Rankin scale (mRS): 4 Stroke scale 12   ROS:  ROS was performed and is negative except as noted in the HPI.-Per family  Past Medical History:  Diagnosis Date  . Abdominal hernia   . Coronary atherosclerosis of native coronary artery 2003   a. NSTEMI s/p BMS to distal circumflex and PDA 2003. b. Cath 2007: patent stents with moderate mid to distal LAD disease.  . Diabetes mellitus, type 2 ([place])   . Diastolic dysfunction    Grade 2. EF 55-60%  . Diverticulosis   . Essential hypertension, benign   . GERD (gastroesophageal reflux disease)   . Hyperlipidemia   . Irritable bowel syndrome   . Nephrolithiasis   . NSTEMI (non-ST elevated  myocardial infarction) ([place])      Family History  Problem Relation Age of Onset  . Heart attack Mother   . Hyperlipidemia Mother   . Diabetes Mother   . Heart attack Father   . Hyperlipidemia Father   . Heart attack Sister   . Hyperlipidemia Sister    Social History:   reports that he has never smoked. He has never used smokeless tobacco. He reports that he does not drink alcohol or use drugs.  Medications  Current Facility-Administered Medications:  .  sodium chloride 0.9 % bolus 1,000 mL, 1,000 mL, Intravenous, Once, Domenic Moras, PA-C .  sodium chloride flush (NS) 0.9 % injection 3 mL, 3 mL, Intravenous, Once, Messick, Wallis Bamberg, MD  Current Outpatient Medications:  .  amLODipine (NORVASC) 10 MG tablet, , Disp: , Rfl:  .  amLODipine (NORVASC) 5 MG tablet, Take 5 mg by mouth daily., Disp: , Rfl:  .  aspirin EC 81 MG tablet, Take 81 mg by mouth daily., Disp: , Rfl:  .  atorvastatin (LIPITOR) 40 MG tablet, Take 40 mg by mouth daily., Disp: , Rfl:  .  divalproex (DEPAKOTE ER) 250 MG 24 hr tablet, Take 1 tablet every night, Disp: 30 tablet, Rfl: 11 .  erythromycin ophthalmic ointment, , Disp: , Rfl:  .  escitalopram (LEXAPRO) 10 MG tablet, Take 2 tablets every day, Disp: 180 tablet, Rfl: 3 .  fenofibrate (TRICOR) 145 MG tablet, Take 1 tablet by mouth daily., Disp: ,  Rfl:  .  fish oil-omega-3 fatty acids 1000 MG capsule, Take 2 g by mouth 3 (three) times daily. , Disp: , Rfl:  .  glipiZIDE (GLUCOTROL) 10 MG tablet, Take 1 tablet by mouth daily., Disp: , Rfl:  .  hydrochlorothiazide (HYDRODIURIL) 25 MG tablet, , Disp: , Rfl:  .  HYDROcodone-acetaminophen (NORCO/VICODIN) 5-325 MG tablet, Take 1 tablet by mouth every 6 (six) hours as needed. , Disp: , Rfl: 0 .  levofloxacin (LEVAQUIN) 500 MG tablet, , Disp: , Rfl:  .  lisinopril (PRINIVIL,ZESTRIL) 2.5 MG tablet, , Disp: , Rfl:  .  losartan (COZAAR) 25 MG tablet, , Disp: , Rfl:  .  Melatonin 3 MG CAPS, Take 2 capsules every night, Disp:  60 capsule, Rfl: 11 .  memantine (NAMENDA) 5 MG tablet, , Disp: , Rfl:  .  metFORMIN (GLUCOPHAGE) 500 MG tablet, Take 2 tablets by mouth 2 (two) times daily., Disp: , Rfl:  .  nitroGLYCERIN (NITROSTAT) 0.4 MG SL tablet, Place 1 tablet (0.4 mg total) under the tongue every 5 (five) minutes as needed for chest pain., Disp: 25 tablet, Rfl: 3 .  PROAIR RESPICLICK 622 (90 Base) MCG/ACT AEPB, 2 puffs once daily, Disp: , Rfl: 0 .  zolpidem (AMBIEN) 5 MG tablet, take 1 tablet by mouth at bedtime for sleep as needed, Disp: , Rfl: 0   Exam: Current vital signs: BP (!) 140/91   Pulse 90   Temp 100.2 F (37.9 C) (Rectal)   Resp (!) 23   Ht 5\' 8"  (1.727 m)   Wt 81.6 kg   SpO2 97%   BMI 27.37 kg/m  Vital signs in last 24 hours: Temp:  [99.9 F (37.7 C)-100.2 F (37.9 C)] 100.2 F (37.9 C) (01/30 1315) Pulse Rate:  [90-91] 90 (01/30 1258) Resp:  [16-23] 23 (01/30 1258) BP: (140)/(91) 140/91 (01/30 1224) SpO2:  [94 %-97 %] 97 % (01/30 1258) Weight:  [81.6 kg] 81.6 kg (01/30 1227)  Physical Exam  Constitutional: Appears well-developed and well-nourished.  Psych: Affect appropriate to situation Eyes: No scleral injection HENT: No OP obstrucion Head: Normocephalic.  Cardiovascular: Normal rate and regular rhythm.  Respiratory: Effort normal, non-labored breathing GI: Soft.  No distension. There is no tenderness.  Skin: WDI  Neuro: Mental Status: Patient is awake, not alert to person, place, objects and cannot follow commands however this is his baseline per family secondary to his dementia Unable to give clear history secondary to dementia Per family he is having difficulty expressing himself and understanding more than normal thus he may have some aphasia Cranial Nerves: II: Stiff threat bilaterally  III,IV, VI: EOMI without ptosis or diploplia. pupils are equal, round, and reactive to light.   V: Facial sensation is symmetric to temperature VII: Right facial droop VIII: hearing is  intact to voice X: Uvula elevates symmetrically XI: Shoulder shrug is symmetric. XII: tongue is midline without atrophy or fasciculations.  Motor: Patient was having difficulty following commands however it was noted that he was weaker on the right arm with 4/5, left arm and appeared to be stronger with 5/5.  Bilateral legs he showed no effort against gravity but with continued plantar stimulation it was noted that the right leg was weaker than the left Sensory: Sensation is symmetric to light touch and temperature in the arms and legs. Deep Tendon Reflexes: 2+ throughout with exception of at the Achilles Plantars: Toes are downgoing bilaterally.  Cerebellar: Finger-nose did not show any dysmetria however I could not get  patient to follow commands with heel-to-shin  Labs I have reviewed labs in epic and the results pertinent to this consultation are:   CBC    Component Value Date/Time   WBC 10.5 11/20/2018 1237   RBC 5.04 11/20/2018 1237   HGB 15.1 11/20/2018 1237   HCT 46.8 11/20/2018 1237   PLT 318 11/20/2018 1237   MCV 92.9 11/20/2018 1237   MCH 30.0 11/20/2018 1237   MCHC 32.3 11/20/2018 1237   RDW 12.8 11/20/2018 1237   LYMPHSABS 1.3 11/20/2018 1237   MONOABS 0.6 11/20/2018 1237   EOSABS 0.0 11/20/2018 1237   BASOSABS 0.0 11/20/2018 1237    CMP     Component Value Date/Time   NA 142 06/11/2014 0336   K 4.4 06/11/2014 0336   CL 105 06/11/2014 0336   CO2 24 06/11/2014 0336   GLUCOSE 139 (H) 06/11/2014 0336   BUN 18 06/11/2014 0336   CREATININE 1.02 06/11/2014 0336   CALCIUM 9.2 06/11/2014 0336   PROT 7.0 06/10/2014 1538   ALBUMIN 4.1 06/10/2014 1538   AST 26 06/10/2014 1538   ALT 19 06/10/2014 1538   ALKPHOS 32 (L) 06/10/2014 1538   BILITOT 0.5 06/10/2014 1538   GFRNONAA 74 (L) 06/11/2014 0336   GFRAA 86 (L) 06/11/2014 0336    Lipid Panel     Component Value Date/Time   CHOL 163 06/11/2014 0336   TRIG 293 (H) 06/11/2014 0336   HDL 27 (L) 06/11/2014  0336   CHOLHDL 6.0 06/11/2014 0336   VLDL 59 (H) 06/11/2014 0336   LDLCALC 77 06/11/2014 0336     Imaging I have reviewed the images obtained: All imaging pending  Deshaun Weisinger PA-C [facility] [phone]  M-F  (9:00 am- 5:00 PM)  11/20/2018, 2:06 PM   Attending addendum I have seen and examined the patient independently.  73 year old with past history of end STEMI, advanced dementia, hypertension hyperlipidemia diabetes who has been having a few days worth of difficulty in generally ambulating and has had poor appetite for the past 3 days had a fall this morning try to get off from the bed.  They initially noted that he might be weak on the right and initial evaluation in the ED also reported right-sided weakness. On my examination, documented below was essentially nonfocal and more encephalopathic. Neurological examination Awake, alert, oriented to self.  Could not tell me his age or correct date of birth. Could not tell me today's date or where he is at. Speech is mildly dysarthric and hesitant He perseverates.  He named the first object I showed him which was a watch.  Upon showing him subsequent objects he kept perseverating watch. He was able to repeat a sentence without hesitation. Follows simple commands inconsistently. Cranial nerves: Pupils are equal round reactive to light, extraocular movements are intact, he blinks to threat from both sides, his face is symmetric, his palate elevates symmetrically and tongue is midline. Motor exam: He does not have a drift on any of the 4 extremities but he has a pronounced resting tremor on the left upper extremity and a mild resting tremor in the right upper extremity as well.  He has some cogwheeling as well as paratonia. Sensory exam: Intact light touch all over Coordination: Tremor as above.  Difficult to perform finger-nose-finger bilaterally. NIH stroke scale-total of 5 which was different than what was observed by my  PA Etta Quill as documented above. 1a Level of Conscious.: 0 1b LOC Questions: 2 1c LOC Commands:  1 2 Best Gaze: 0 3 Visual: 0 4 Facial Palsy: 0 5a Motor Arm - left: 0 5b Motor Arm - Right: 0 6a Motor Leg - Left: 0 6b Motor Leg - Right: 0 7 Limb Ataxia: 0 8 Sensory: 0 9 Best Language: 1 10 Dysarthria: 1 11 Extinct. and Inatten.: 0 TOTAL: 5  Imaging: MRI of the brain-no acute stroke.  Formal read pending. Other pertinent labs- negative for influenza.  WBC 10.5, normal hemoglobin hematocrit, Lactate 3.1.  Creatinine elevated 1.50. Urinalysis not remarkable for UTI Chest x-ray shows no active cardiopulmonary disease.  Assessment: 73 year old brought in with altered mental status and concern for right-sided weakness.  Symptoms have been ongoing for a few days but this morning he had a fall with possible right-sided weakness. Exam has been fluctuating with off-and-on right-sided weakness and variable level of consciousness.  On my examination, he was fully cooperative, followed simple commands intermittently and had no weakness.  It was reported by the ED providers in my PA that he had right-sided weakness and was unable to lift both his lower extremities off the bed. He has an elevated lactate at 3.1.  Concern for influenza and flu screen was sent which is negative. Given his elevated lactate and altered mental status, I would investigate for infectious causes. MRI does not look impressive for stroke that can explain his current symptoms. Seizures could also be in the differential.  Impression Altered mental status-toxic metabolic encephalopathy-investigate for infections Possible behavioral disturbance in the setting of advanced dementia. Possible polypharmacy Evaluate for underlying seizures  Recommendations: Obtain MRI of the brain. If MRI brain is negative for stroke, no further stroke up is necessary. Obtain routine EEG Supportive care per primary team. Reduce sedating  medications including opiates.  Evaluate for any evidence of underlying infections and correct toxic metabolic derangements as necessary per primary team.  Neurology will continue to follow with you.  -- Amie Portland, MD [facility] Pager: [phone] If 7pm to 7am, please call on call as listed on AMION.

## 2018-11-20 NOTE — Progress Notes (Signed)
Received pt from ED, alert x1 to person, disoriented to place and time. Vitals signs within normal limits.  ooriented to unit and staff, will endorsed .

## 2018-11-21 ENCOUNTER — Observation Stay (HOSPITAL_COMMUNITY): Payer: Medicare Other

## 2018-11-21 DIAGNOSIS — G9340 Encephalopathy, unspecified: Secondary | ICD-10-CM

## 2018-11-21 DIAGNOSIS — N179 Acute kidney failure, unspecified: Secondary | ICD-10-CM

## 2018-11-21 DIAGNOSIS — E860 Dehydration: Secondary | ICD-10-CM

## 2018-11-21 DIAGNOSIS — G9341 Metabolic encephalopathy: Secondary | ICD-10-CM | POA: Diagnosis not present

## 2018-11-21 DIAGNOSIS — E118 Type 2 diabetes mellitus with unspecified complications: Secondary | ICD-10-CM

## 2018-11-21 DIAGNOSIS — F0390 Unspecified dementia without behavioral disturbance: Secondary | ICD-10-CM | POA: Diagnosis not present

## 2018-11-21 DIAGNOSIS — I10 Essential (primary) hypertension: Secondary | ICD-10-CM

## 2018-11-21 DIAGNOSIS — I2510 Atherosclerotic heart disease of native coronary artery without angina pectoris: Secondary | ICD-10-CM

## 2018-11-21 DIAGNOSIS — E876 Hypokalemia: Secondary | ICD-10-CM

## 2018-11-21 LAB — BASIC METABOLIC PANEL
ANION GAP: 9 (ref 5–15)
BUN: 12 mg/dL (ref 8–23)
CALCIUM: 8.9 mg/dL (ref 8.9–10.3)
CO2: 24 mmol/L (ref 22–32)
Chloride: 109 mmol/L (ref 98–111)
Creatinine, Ser: 1.23 mg/dL (ref 0.61–1.24)
GFR calc Af Amer: >60 mL/min (ref >60)
GFR calc non Af Amer: 59 mL/min — ABNORMAL LOW (ref >60)
Glucose, Bld: 135 mg/dL — ABNORMAL HIGH (ref 70–99)
Potassium: 3.4 mmol/L — ABNORMAL LOW (ref 3.5–5.1)
Sodium: 142 mmol/L (ref 135–145)

## 2018-11-21 LAB — RAPID URINE DRUG SCREEN, HOSP PERFORMED
Amphetamines: NOT DETECTED
BENZODIAZEPINES: NOT DETECTED
Barbiturates: NOT DETECTED
Cocaine: NOT DETECTED
Opiates: NOT DETECTED
Tetrahydrocannabinol: NOT DETECTED

## 2018-11-21 LAB — GLUCOSE, CAPILLARY: Glucose-Capillary: 178 mg/dL — ABNORMAL HIGH (ref 70–99)

## 2018-11-21 MED ORDER — POTASSIUM CHLORIDE CRYS ER 20 MEQ PO TBCR
40.0000 meq | EXTENDED_RELEASE_TABLET | ORAL | Status: AC
  Administered 2018-11-21: 40 meq via ORAL
  Filled 2018-11-21: qty 2

## 2018-11-21 NOTE — Progress Notes (Signed)
EEG completed; results pending.    

## 2018-11-21 NOTE — Progress Notes (Signed)
Pt for discharge going home with wife , pt alert and oriented, EEG done, no complain of pain, discontinued peripheral IV line, did health teachings, next appointment, due med explained and understood, given all his personal belongings.

## 2018-11-21 NOTE — Evaluation (Signed)
Physical Therapy Evaluation Patient Details Name: Dustin [name] MRN: [id number] DOB: 1946/03/03 Today's Date: 11/21/2018   History of Present Illness  73 y.o. male, with past medical history significant for coronary artery disease status post and STEMI, history of hypertension, diabetes and dementia brought to the emergency room today because of falls and right-sided weakness. MRI revealed Motion degraded examination without evidence of acute intracranial abnormality. Mild-to-moderate chronic small vessel ischemic disease. 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 Severe right P2 stenosis. Possible moderate to severe bilateral P1 stenoses.  Clinical Impression  PTA pt living with wife in single story home with 3 steps to enter. Pt with variable physical performance, ambulating with cane independently and requiring assist from wife and family, hx of falling. Pt requires assist for ADLs. Pt is limited by decreased cognition, decreased coordination, decreased knowledge of DME and decreased safety awareness. Pt requires modA for bed mobility, transfers and ambulation of 5 feet with RW.  PT recommends SNF level rehab at discharge, which wife has already refused. In light of this pt will need 24 hr trained assistance and HHPT. PT will continue to follow acutely.     Follow Up Recommendations SNF    Equipment Recommendations  None recommended by PT    Recommendations for Other Services OT consult     Precautions / Restrictions Precautions Precautions: Fall Precaution Comments: hx of falling Restrictions Weight Bearing Restrictions: No      Mobility  Bed Mobility Overal bed mobility: Needs Assistance Bed Mobility: Supine to Sit     Supine to sit: Mod assist     General bed mobility comments: modA for trunk to upright and LE mangement off bed and pad scoot to EoB, pt able to initiate movement but difficulty coordinating movement to get to EoB  Transfers Overall transfer  level: Needs assistance Equipment used: Rolling walker (2 wheeled) Transfers: Sit to/from Stand Sit to Stand: Mod assist         General transfer comment: modA for power up and steadying with RW. no complaints of dizziness with standing   Ambulation/Gait Ambulation/Gait assistance: Mod assist Gait Distance (Feet): 5 Feet Assistive device: Rolling walker (2 wheeled) Gait Pattern/deviations: Step-through pattern;Decreased step length - right;Decreased step length - left;Shuffle Gait velocity: slow Gait velocity interpretation: <1.31 ft/sec, indicative of household ambulator General Gait Details: modA for steadying with maximal verbal and tactile cues for sequencing, proximity to RW, and problem solving to get from bed to recliner   Stairs            Wheelchair Mobility    Modified Rankin (Stroke Patients Only) Modified Rankin (Stroke Patients Only) Pre-Morbid Rankin Score: Moderately severe disability Modified Rankin: Moderately severe disability     Balance Overall balance assessment: Needs assistance Sitting-balance support: Feet supported;No upper extremity supported Sitting balance-Leahy Scale: Fair Sitting balance - Comments: able to progress to fair balance, once feet on floor and weight shifted forward   Standing balance support: Bilateral upper extremity supported Standing balance-Leahy Scale: Zero                               Pertinent Vitals/Pain Pain Assessment: No/denies pain    Home Living Family/patient expects to be discharged to:: Private residence Living Arrangements: Spouse/significant other Available Help at Discharge: Family;Available 24 hours/day Type of Home: House Home Access: Stairs to enter Entrance Stairs-Rails: Right Entrance Stairs-Number of Steps: 3 Home Layout: Laundry or work area in basement;One  level;Able to live on main level with bedroom/bathroom Home Equipment: Gilford Rile - 2 wheels;Cane - single point;Bedside  commode      Prior Function Level of Independence: Needs assistance   Gait / Transfers Assistance Needed: ambulating in house with cane, some days with wife's assistance and others without,  only able to ambulate household distances  ADL's / Homemaking Assistance Needed: wife assists with ADLs,            Extremity/Trunk Assessment   Upper Extremity Assessment Upper Extremity Assessment: RUE deficits/detail;LUE deficits/detail RUE Deficits / Details: stength grossly 3+/5, shoulder flexing limited to approximately 120 degrees, pt with delayed response to commands  RUE Coordination: decreased fine motor;decreased gross motor LUE Deficits / Details: stength grossly 4/5, shoulder flexing limited to approximately 120 degrees, pt with delayed response to commands  LUE Coordination: decreased fine motor;decreased gross motor    Lower Extremity Assessment Lower Extremity Assessment: RLE deficits/detail;LLE deficits/detail RLE Deficits / Details: AROM WFL, strength grossly assessed at 3+/5, delayed response to commands RLE Coordination: decreased fine motor;decreased gross motor LLE Deficits / Details: AROM WFL, strength grossly assessed at 4/5, delayed response to commands LLE Coordination: decreased fine motor;decreased gross motor    Cervical / Trunk Assessment Cervical / Trunk Assessment: Normal  Communication   Communication: Receptive difficulties  Cognition Arousal/Alertness: Awake/alert Behavior During Therapy: WFL for tasks assessed/performed Overall Cognitive Status: History of cognitive impairments - at baseline Area of Impairment: Orientation;Attention;Memory;Following commands;Safety/judgement;Awareness;Problem solving                 Orientation Level: Person Current Attention Level: Selective Memory: Decreased recall of precautions;Decreased short-term memory Following Commands: Follows one step commands inconsistently;Follows one step commands with increased  time Safety/Judgement: Decreased awareness of safety;Decreased awareness of deficits Awareness: Emergent Problem Solving: Slow processing;Decreased initiation;Difficulty sequencing;Requires verbal cues;Requires tactile cues General Comments: Pt with history of progressing dementia, pt does not do well with novel tasks, and is limited by slow processing       General Comments General comments (skin integrity, edema, etc.): Pt with some intention tremors. Pt wife and son present throughout session. Pt's wife is adamant about return home after hospitalization "whatever it takes".         Assessment/Plan    PT Assessment Patient needs continued PT services  PT Problem List Decreased balance;Decreased mobility;Decreased coordination;Decreased cognition;Decreased knowledge of use of DME;Decreased safety awareness       PT Treatment Interventions DME instruction;Gait training;Functional mobility training;Therapeutic activities;Therapeutic exercise;Cognitive remediation;Balance training;Patient/family education    PT Goals (Current goals can be found in the Care Plan section)  Acute Rehab PT Goals Patient Stated Goal: none stated PT Goal Formulation: With patient/family Time For Goal Achievement: 12/05/18 Potential to Achieve Goals: Fair    Frequency Min 3X/week    AM-PAC PT "6 Clicks" Mobility  Outcome Measure Help needed turning from your back to your side while in a flat bed without using bedrails?: A Little Help needed moving from lying on your back to sitting on the side of a flat bed without using bedrails?: A Lot Help needed moving to and from a bed to a chair (including a wheelchair)?: A Lot Help needed standing up from a chair using your arms (e.g., wheelchair or bedside chair)?: A Lot Help needed to walk in hospital room?: Total Help needed climbing 3-5 steps with a railing? : Total 6 Click Score: 11    End of Session Equipment Utilized During Treatment: Gait  belt Activity Tolerance: Patient tolerated treatment well  Patient left: in chair;with call bell/phone within reach;with chair alarm set;with family/visitor present Nurse Communication: Mobility status;Precautions PT Visit Diagnosis: Unsteadiness on feet (R26.81);Other abnormalities of gait and mobility (R26.89);Repeated falls (R29.6);Muscle weakness (generalized) (M62.81);Difficulty in walking, not elsewhere classified (R26.2);Other symptoms and signs involving the nervous system (R29.898)    Time: 6387-5643 PT Time Calculation (min) (ACUTE ONLY): 60 min   Charges:   PT Evaluation $PT Eval Moderate Complexity: 1 Mod PT Treatments $Gait Training: 23-37 mins $Self Care/Home Management: 8-22        Trayson Stitely B. Migdalia Dk PT, DPT Acute Rehabilitation Services Pager [phone] Office [phone]   [place] 11/21/2018, 1:04 PM

## 2018-11-21 NOTE — Plan of Care (Signed)
  Problem: Education: Goal: Knowledge of General Education information will improve Description: Including pain rating scale, medication(s)/side effects and non-pharmacologic comfort measures Outcome: Progressing   Problem: Health Behavior/Discharge Planning: Goal: Ability to manage health-related needs will improve Outcome: Progressing   Problem: Clinical Measurements: Goal: Ability to maintain clinical measurements within normal limits will improve Outcome: Progressing Goal: Will remain free from infection Outcome: Progressing Goal: Diagnostic test results will improve Outcome: Progressing   Problem: Activity: Goal: Risk for activity intolerance will decrease Outcome: Progressing   Problem: Nutrition: Goal: Adequate nutrition will be maintained Outcome: Progressing   Problem: Pain Managment: Goal: General experience of comfort will improve Outcome: Progressing   

## 2018-11-21 NOTE — Care Management Obs Status (Signed)
[place] NOTIFICATION   Patient Details  Name: Dustin [name] MRN: [id number] Date of Birth: December 23, 1945   Medicare Observation Status Notification Given:  Yes    Marilu Favre, RN 11/21/2018, 11:36 AM

## 2018-11-21 NOTE — Care Management Note (Addendum)
Case Management Note  Patient Details  Name: Dustin [name] MRN: [id number] Date of Birth: 03-09-1946  Subjective/Objective:                    Action/Plan:  Explained Medicare observation letter to patient's wife and family at bedside.  Wife and family wanting to take patient home with home health through [facility], will need orders and face to face.  [place] [facility] not available at patient's address.   Patient has bedside commode , walker, Rolator , shower chair and a rail on his bed at home.   Received orders for home health RN,PT,OT,SW, aide Expected Discharge Date:                  Expected Discharge Plan:  [place]  In-House Referral:     Discharge planning Services  CM Consult  Post Acute Care Choice:  [place] Choice offered to:  Spouse  DME Arranged:  N/A DME Agency:  NA  HH Arranged:   RN,PT,OT,aide,SW [place] Agency:  [facility]  Status of Service:  In process, will continue to follow  If discussed at Long Length of Stay Meetings, dates discussed:    Additional Comments:  Marilu Favre, RN 11/21/2018, 11:46 AM

## 2018-11-21 NOTE — Progress Notes (Signed)
PT Cancellation Note  Patient Details Name: Dustin [name] MRN: [id number] DOB: April 10, 1946   Cancelled Treatment:    Reason Eval/Treat Not Completed: (P) Patient at procedure or test/unavailable Pt having procedure done in room. PT will follow back for evaluation as able.   Desha Bitner B. Migdalia Dk PT, DPT Acute Rehabilitation Services Pager [phone] Office [phone]    [place] 11/21/2018, 9:33 AM

## 2018-11-21 NOTE — Procedures (Signed)
ELECTROENCEPHALOGRAM REPORT   Patient: Dustin [name]       Room #: 9I50T EEG No. ID: [id number] Age: 73 y.o.        Sex: male Referring Physician: Tamala Julian Report Date:  11/21/2018        Interpreting Physician: Alexis Goodell  History: Dustin [name] is an 73 y.o. male with a history of dementia presenting with new onset right sided weakness  Medications:  Lovaza, Namenda, Prinivil, Cozaar, Melatonin, Fenofibrate, HCTZ, Depakote, Lipitor, Norvasc, ASA  Conditions of Recording:  This is a 21 channel routine scalp EEG performed with bipolar and monopolar montages arranged in accordance to the international 10/20 system of electrode placement. One channel was dedicated to EKG recording.  The patient is in the awake state.  Description:  The waking background activity consists of a low voltage, symmetrical, fairly well organized, 6-7 Hz theta activity, seen from the parieto-occipital and posterior temporal regions.  Low voltage fast activity, poorly organized, is seen anteriorly and is at times superimposed on more posterior regions.  A mixture of theta and alpha rhythms are seen from the central and temporal regions. The patient does not drowse or sleep. No epileptiform activity is noted.   Hyperventilation and intermittent photic stimulation were not performed.   IMPRESSION: This is an abnormal EEG secondary to posterior background slowing.  This finding may be seen with a diffuse gray matter disturbance that is etiologically nonspecific, but may include a dementia, among other possibilities.  No epileptiform activity is noted.     Alexis Goodell, MD Neurology [phone] 11/21/2018, 10:18 AM

## 2018-11-21 NOTE — Progress Notes (Addendum)
NEURO HOSPITALIST PROGRESS NOTE   Subjective: Patient awake, alert, NAD. Wife and Son at bedside. Per family patient is doing better today than yesterday.   Exam: Vitals:   11/20/18 2046 11/21/18 0454  BP: (!) 135/99 99/87  Pulse: (!) 56 60  Resp: 18 18  Temp: 98.9 F (37.2 C) 98.2 F (36.8 C)  SpO2: 94% 95%    Physical Exam  Constitutional: Appears well-developed and well-nourished.  Psych: Affect appropriate to situation Eyes: No scleral injection HENT: No OP obstrucion Head: Normocephalic.  Cardiovascular: Normal rate and regular rhythm.  Respiratory: Effort normal, non-labored breathing GI: Soft.  No distension. There is no tenderness.  Skin: WDI  Neuro: Mental Status: Patient is awake,alert, oriented to person and could tell me the relationship of family members in the room. Could not tell me month, year,age. Was able to state he is in hospital and not at home.  Was able to follow simple commands. Naming intact ( key, thumb, pen). Cranial Nerves: II: blinks to threat bilaterally III,IV, VI: EOMI without ptosis or diploplia. pupils are equal, round, and reactive to light.   V: Facial sensation is symmetric to light touch VII: no facial droop noted VIII: hearing is intact to voice X: Uvula elevates symmetrically XI: Shoulder shrug is symmetric. XII: tongue is midline without atrophy or fasciculations.  Motor: 4/5 in BUE 5/5 BLE Sensory: Sensation is symmetric to light touchin the arms and legs. Deep Tendon Reflexes: 2+ throughout biceps and knee jerk Plantars: Toes are downgoing bilaterally.  Cerebellar: FNF intact; did note left arm tremor with action.   Medications:  Scheduled: . amLODipine  5 mg Oral Daily  . aspirin EC  81 mg Oral Daily  . atorvastatin  40 mg Oral Daily  . divalproex  250 mg Oral Daily  . enoxaparin (LOVENOX) injection  40 mg Subcutaneous Q24H  . fenofibrate  54 mg Oral Daily  . hydrochlorothiazide  25 mg Oral  Daily  . lisinopril  2.5 mg Oral Daily  . losartan  25 mg Oral Daily  . Melatonin  3 mg Oral Nightly  . memantine  5 mg Oral BID  . omega-3 acid ethyl esters  2 g Oral TID   Continuous: . sodium chloride 75 mL/hr at 11/21/18 0200   UYQ:[id number] **OR** acetaminophen, albuterol, nitroGLYCERIN, ondansetron **OR** ondansetron (ZOFRAN) IV  Pertinent Labs/Diagnostics:   EEG: 11/21/2018 This is an abnormal EEG secondary to posterior background slowing.  This finding may be seen with a diffuse gray matter disturbance that is etiologically nonspecific, but may include a dementia, among other possibilities.  No epileptiform activity is noted.    B12, TSH: WNL U/A: no UTI Blood cultures: pending  Dg Chest 2 View  Result Date: 11/20/2018 CLINICAL DATA:  Chest pain, shortness of breath. EXAM: CHEST - 2 VIEW COMPARISON:  Radiograph of June 10, 2014. FINDINGS: Stable cardiomegaly. No pneumothorax or pleural effusion is noted. Both lungs are clear. The visualized skeletal structures are unremarkable. IMPRESSION: No active cardiopulmonary disease. Electronically Signed   By: Marijo Conception, M.D.   On: 11/20/2018 13:28   Mr Dustin [name] Head [name] Contrast  Result Date: 11/20/2018 CLINICAL DATA:  Right-sided weakness.  Aphasia. EXAM: MRI HEAD WITHOUT CONTRAST MRA HEAD WITHOUT CONTRAST TECHNIQUE: Multiplanar, multiecho pulse sequences of the Dustin and surrounding structures were obtained without intravenous contrast. Angiographic images of the head were obtained using  MRA technique without contrast. COMPARISON:  Head CT 08/07/2016 FINDINGS: MRI HEAD FINDINGS The study is motion degraded throughout with some sequences being severely degraded. Dustin: No acute infarct is identified although motion artifact reduces sensitivity for detection of very small infarcts. Scattered chronic microhemorrhages are noted in both cerebral hemispheres. No mass, midline shift, or extra-axial fluid collection is identified. Small  chronic infarcts are noted in the right occipital lobe and right cerebellum. Patchy T2 hyperintensities in the cerebral white matter bilaterally are nonspecific but compatible with mild-to-moderate chronic small vessel ischemic disease. There is moderate cerebral atrophy. Vascular: Major intracranial vascular flow voids are preserved. Skull and upper cervical spine: No destructive skull lesion. Sinuses/Orbits: Unremarkable orbits. Trace left mastoid fluid. Clear paranasal sinuses. Other: None. MRA HEAD FINDINGS The study is moderately motion degraded. The visualized distal vertebral arteries are patent with the left being strongly dominant and supplying the basilar artery. The right vertebral artery ends in PICA. The basilar artery is widely patent. The PCAs are patent with asymmetric attenuation of the right PCA and evidence of a severe proximal to mid right P2 stenosis. There may be moderate to severe distal P1 stenoses bilaterally versus artifact. The internal carotid arteries are patent from skull base to carotid termini without evidence of significant stenosis within limitations of motion. ACAs and MCAs are patent proximally with the left A1 segment appearing hypoplastic. The right A1 segment is widely patent. Both M1 segments also appear widely patent although there is artifactual signal loss at both MCA bifurcations. ACA and MCA branch vessels are not well evaluated due to motion. No aneurysm is identified. IMPRESSION: 1. Motion degraded examination without evidence of acute intracranial abnormality. 2. Mild-to-moderate chronic small vessel ischemic disease. 3. 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 4. Motion degraded head MRA without evidence of large vessel occlusion. 5. Severe right P2 stenosis. Possible moderate to severe bilateral P1 stenoses. Electronically Signed   By: Logan Bores M.D.   On: 11/20/2018 16:00   Mr Dustin [name] Contrast  Result Date: 11/20/2018 CLINICAL DATA:   Right-sided weakness.  Aphasia. EXAM: MRI HEAD WITHOUT CONTRAST MRA HEAD WITHOUT CONTRAST TECHNIQUE: Multiplanar, multiecho pulse sequences of the Dustin and surrounding structures were obtained without intravenous contrast. Angiographic images of the head were obtained using MRA technique without contrast. COMPARISON:  Head CT 08/07/2016 FINDINGS: MRI HEAD FINDINGS The study is motion degraded throughout with some sequences being severely degraded. Dustin: No acute infarct is identified although motion artifact reduces sensitivity for detection of very small infarcts. Scattered chronic microhemorrhages are noted in both cerebral hemispheres. No mass, midline shift, or extra-axial fluid collection is identified. Small chronic infarcts are noted in the right occipital lobe and right cerebellum. Patchy T2 hyperintensities in the cerebral white matter bilaterally are nonspecific but compatible with mild-to-moderate chronic small vessel ischemic disease. There is moderate cerebral atrophy. Vascular: Major intracranial vascular flow voids are preserved. Skull and upper cervical spine: No destructive skull lesion. Sinuses/Orbits: Unremarkable orbits. Trace left mastoid fluid. Clear paranasal sinuses. Other: None. MRA HEAD FINDINGS The study is moderately motion degraded. The visualized distal vertebral arteries are patent with the left being strongly dominant and supplying the basilar artery. The right vertebral artery ends in PICA. The basilar artery is widely patent. The PCAs are patent with asymmetric attenuation of the right PCA and evidence of a severe proximal to mid right P2 stenosis. There may be moderate to severe distal P1 stenoses bilaterally versus artifact. The internal carotid arteries are patent from  skull base to carotid termini without evidence of significant stenosis within limitations of motion. ACAs and MCAs are patent proximally with the left A1 segment appearing hypoplastic. The right A1 segment is  widely patent. Both M1 segments also appear widely patent although there is artifactual signal loss at both MCA bifurcations. ACA and MCA branch vessels are not well evaluated due to motion. No aneurysm is identified. IMPRESSION: 1. Motion degraded examination without evidence of acute intracranial abnormality. 2. Mild-to-moderate chronic small vessel ischemic disease. 3. 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 4. Motion degraded head MRA without evidence of large vessel occlusion. 5. Severe right P2 stenosis. Possible moderate to severe bilateral P1 stenoses. Electronically Signed   By: Logan Bores M.D.   On: 11/20/2018 16:00   Assessment: 73 year old brought in with altered mental status and concern for right-sided weakness.   Symptoms waxing waning and resolved. Found to be dehydrated on labs. No acute stroke.  No evidence of seizure activity. Likely toxic metabolic encephalopathy in the setting of metabolic derangements. Patient much improved today.  Impression Toxic metabolic encephalopathy in the setting of metabolic derangements  Recommendations: No further neurological recommendations this time. Correction of toxic metabolic derangements per the primary team.   Laurey Morale, MSN, NP-C [facility] [phone]  Attending neurologist's note to follow    11/21/2018, 8:36 AM   Attending Neurohospitalist Addendum Patient seen and examined with APP/Resident. Agree with the history and physical as documented above. Agree with the plan as documented, which I helped formulate. I have independently reviewed the chart, obtained history, review of systems and examined the patient.I have personally reviewed pertinent head/neck/spine imaging (CT/MRI). Please feel free to call with any questions. --- Amie Portland, MD [facility] Pager: [phone]  If 7pm to 7am, please call on call as listed on AMION.

## 2018-11-22 NOTE — Discharge Summary (Signed)
Dustin [name], is a 73 y.o. male  DOB [birth year]  MRN [id number].  Admission date:  11/20/2018  Admitting Physician  Merton Border, MD  Discharge Date:  11/21/2018   Primary MD  Redmond School, MD  Recommendations for primary care physician for things to follow:     Discharge Diagnosis   Principal Problem:   Encephalopathy acute Active Problems:   Coronary atherosclerosis of native coronary artery   Diabetes mellitus, controlled ([place])   Essential hypertension, benign   Dementia ([place])   Hypokalemia   AKI (acute kidney injury) ([place])   Dehydration      Past Medical History:  Diagnosis Date  . Abdominal hernia   . Coronary atherosclerosis of native coronary artery 2003   a. NSTEMI s/p BMS to distal circumflex and PDA 2003. b. Cath 2007: patent stents with moderate mid to distal LAD disease.  . Diabetes mellitus, type 2 ([place])   . Diastolic dysfunction    Grade 2. EF 55-60%  . Diverticulosis   . Essential hypertension, benign   . GERD (gastroesophageal reflux disease)   . Hyperlipidemia   . Irritable bowel syndrome   . Nephrolithiasis   . NSTEMI (non-ST elevated myocardial infarction) [facility])     Past Surgical History:  Procedure Laterality Date  . COLONOSCOPY  2011   Diverticulosis  . CORONARY STENT PLACEMENT    . LITHOTRIPSY         HPI  from the history and physical done on the day of admission:  Dustin [name]  is a 73 y.o. male, with past medical history significant for coronary artery disease status post and STEMI, history of hypertension, diabetes and dementia brought to the emergency room today because of falls and right-sided weakness originally.  He was more confused than usual and according to the records he had a fall today while trying to go to the bathroom.  Neurology was consulted because of  right-sided weakness which did not persist during his stay in the emergency room.  He was noted to have a low-grade fever and work-up was basically negative mild leukocytosis and negative flu.  The rest of the work-up is basically negative as well except for mild renal insufficiencyIII .  The son reports slurring of speech earlier but according to the son he is back to his baseline at this time.  Work-up including MRI of the brain, urinalysis, toxicology were all negative and could not explain the worsening symptoms.  I was called to admit for further management. The patient lives at home with his wife who usually helps with his ADLs.     Hospital Course:   1.  Acute encephalopathy with history of dementia: Resolved.  His presenting symptoms question to be related to a stroke.  Family makes note the patient is not on pain medications, and rarely ever uses them.  Medications shown and hydrocodone was not one of medications present.  UDS negative and alcohol level undetectable.  His initial blood glucose noted to be 76 on arrival.  CT/MRI  negative for any acute abnormalities.  TSH within normal limits.  Vitamin B12 at the lower limit of normal.  With IV fluids symptoms resolved and patient was noted to be back at his baseline per family.  Patient's wife helps care for him at home.  May warrant vitamin B12 replacement in outpatient setting.  Discussed importance of keeping the patient hydrated as dehydration can make patient altered.  Also warned family that low blood sugars can cause similar appearance as patient is on oral hypoglycemic agent.    2.  Lactic acidosis: Resolving.  On admission lactic acid 3.1.  WBC was noted to be at upper limit of normal, but patient remained afebrile.  Patient was worked up for the possibility of infection.  Admission chest x-ray, urinalysis, and influenza screens were negative.  He was not placed on antibiotics, and symptoms likely related with dehydration and metformin as  lactic acid was seen to be trending downward to 2.5 with IV fluids.  3.  Acute kidney injury 2/2 dehydration: Resolving.  Patient baseline creatinine previously noted to be within normal limits in 2015.  On admission his creatinine was elevated to 1.52, but after IV fluids improved to 1.23.  Home medications included hydrochlorothiazide which was held.  Family advised for patient to drink adequate fluids  4.  Essential hypertension: Stable.  Blood pressures relatively controlled.  He is advised to hold hydrochlorothiazide due to acute kidney injury.  He was no longer on lisinopril, and he was advised to restart losartan.  5.  Diabetes mellitus type 2, well controlled: Patient currently on oral agents of glipizide and metformin which were initially held.  Patient advised to restart on these medications as blood sugars trending upward to 178.  No recent hemoglobin A1c obtained.  Last available on file 05/2014 was 6.7.  6.  History of coronary artery disease status post NSTEMI: Patient was continued on Lipitor and was advised to resume ACE inhibitor at discharge.  7.  Left ear discomfort 2/2 cerumen impaction: Unable to visualize left ear due to cerumen impaction noted.  Patient advised to utilize over-the-counter Debrox to help with earwax removal.  Family noting that they see his doctor for this.  8.  Hypokalemia: Acute.  Prior to discharge potassium noted to be 3.4.  Patient was given 40 mg of potassium chloride p.o.  Follow UP  Follow-up Information    [facility].   Contact information: [address]  [place] [phone]           Consults obtained : Neurology  Discharge Condition: Stable  Diet and Activity recommendation: See Discharge Instructions below Discharge Instructions    Diet Carb Modified   Complete by:  As directed    Discharge instructions   Complete by:  As directed    It was thought that the patient was  significantly dehydrated and that is the likely cause of his symptoms.  There were no signs of a stroke.  There are no signs of infection in his urine or in his lungs.  It is recommend that patient stop taking hydrochlorothiazide until able to follow-up with his primary care provider and have repeat blood work of a CBC and BMP done within the next week.  It is also advised that glipizide can lower patient's blood glucose if he is not eating.  Please review this medication with your primary care provider.   Home health with physical therapy has been arranged.Follow with Primary (  we routinely change or add medications that can affect your baseline labs and fluid status, therefore we recommend that you get the mentioned basic workup next visit with your PCP, your PCP may decide not to get them or add new tests based on their clinical decision.   Increase activity slowly   Complete by:  As directed         Discharge Medications     Allergies as of 11/21/2018      Reactions   Morphine And Related Nausea And Vomiting   Ziac [bisoprolol-hydrochlorothiazide] Other (See Comments)   Unknown reaction      Medication List    STOP taking these medications   hydrochlorothiazide 25 MG tablet Commonly known as:  HYDRODIURIL   HYDROcodone-acetaminophen 10-325 MG tablet Commonly known as:  NORCO   Melatonin 3 MG Caps     TAKE these medications   amLODipine 10 MG tablet Commonly known as:  NORVASC Take 10 mg by mouth daily.   aspirin EC 81 MG tablet Take 81 mg by mouth daily.   divalproex 250 MG 24 hr tablet Commonly known as:  DEPAKOTE ER Take 1 tablet every night What changed:    how much to take  how to take this  when to take this  additional instructions   escitalopram 10 MG tablet Commonly known as:  LEXAPRO Take 2 tablets every day What changed:    how much to take  how to take this  when to take this  additional instructions   fenofibrate 145 MG tablet Commonly  known as:  TRICOR Take 145 mg by mouth daily.   glipiZIDE 10 MG tablet Commonly known as:  GLUCOTROL Take 10 mg by mouth daily.   losartan 25 MG tablet Commonly known as:  COZAAR Take 25 mg by mouth daily.   metFORMIN 500 MG tablet Commonly known as:  GLUCOPHAGE Take 1,000 mg by mouth 2 (two) times daily.   nitroGLYCERIN 0.4 MG SL tablet Commonly known as:  NITROSTAT Place 1 tablet (0.4 mg total) under the tongue every 5 (five) minutes as needed for chest pain.       Major procedures and Radiology Reports - PLEASE review detailed and final reports for all details, in brief -     Dg Chest 2 View  Result Date: 11/20/2018 CLINICAL DATA:  Chest pain, shortness of breath. EXAM: CHEST - 2 VIEW COMPARISON:  Radiograph of June 10, 2014. FINDINGS: Stable cardiomegaly. No pneumothorax or pleural effusion is noted. Both lungs are clear. The visualized skeletal structures are unremarkable. IMPRESSION: No active cardiopulmonary disease. Electronically Signed   By: Marijo Conception, M.D.   On: 11/20/2018 13:28   Mr Jodene Nam Head Wo Contrast  Result Date: 11/20/2018 CLINICAL DATA:  Right-sided weakness.  Aphasia. EXAM: MRI HEAD WITHOUT CONTRAST MRA HEAD WITHOUT CONTRAST TECHNIQUE: Multiplanar, multiecho pulse sequences of the brain and surrounding structures were obtained without intravenous contrast. Angiographic images of the head were obtained using MRA technique without contrast. COMPARISON:  Head CT 08/07/2016 FINDINGS: MRI HEAD FINDINGS The study is motion degraded throughout with some sequences being severely degraded. Brain: No acute infarct is identified although motion artifact reduces sensitivity for detection of very small infarcts. Scattered chronic microhemorrhages are noted in both cerebral hemispheres. No mass, midline shift, or extra-axial fluid collection is identified. Small chronic infarcts are noted in the right occipital lobe and right cerebellum. Patchy T2 hyperintensities in the  cerebral white matter bilaterally are nonspecific but compatible with mild-to-moderate chronic  small vessel ischemic disease. There is moderate cerebral atrophy. Vascular: Major intracranial vascular flow voids are preserved. Skull and upper cervical spine: No destructive skull lesion. Sinuses/Orbits: Unremarkable orbits. Trace left mastoid fluid. Clear paranasal sinuses. Other: None. MRA HEAD FINDINGS The study is moderately motion degraded. The visualized distal vertebral arteries are patent with the left being strongly dominant and supplying the basilar artery. The right vertebral artery ends in PICA. The basilar artery is widely patent. The PCAs are patent with asymmetric attenuation of the right PCA and evidence of a severe proximal to mid right P2 stenosis. There may be moderate to severe distal P1 stenoses bilaterally versus artifact. The internal carotid arteries are patent from skull base to carotid termini without evidence of significant stenosis within limitations of motion. ACAs and MCAs are patent proximally with the left A1 segment appearing hypoplastic. The right A1 segment is widely patent. Both M1 segments also appear widely patent although there is artifactual signal loss at both MCA bifurcations. ACA and MCA branch vessels are not well evaluated due to motion. No aneurysm is identified. IMPRESSION: 1. Motion degraded examination without evidence of acute intracranial abnormality. 2. Mild-to-moderate chronic small vessel ischemic disease. 3. 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 4. Motion degraded head MRA without evidence of large vessel occlusion. 5. Severe right P2 stenosis. Possible moderate to severe bilateral P1 stenoses. Electronically Signed   By: Logan Bores M.D.   On: 11/20/2018 16:00   Mr Brain Wo Contrast  Result Date: 11/20/2018 CLINICAL DATA:  Right-sided weakness.  Aphasia. EXAM: MRI HEAD WITHOUT CONTRAST MRA HEAD WITHOUT CONTRAST TECHNIQUE: Multiplanar,  multiecho pulse sequences of the brain and surrounding structures were obtained without intravenous contrast. Angiographic images of the head were obtained using MRA technique without contrast. COMPARISON:  Head CT 08/07/2016 FINDINGS: MRI HEAD FINDINGS The study is motion degraded throughout with some sequences being severely degraded. Brain: No acute infarct is identified although motion artifact reduces sensitivity for detection of very small infarcts. Scattered chronic microhemorrhages are noted in both cerebral hemispheres. No mass, midline shift, or extra-axial fluid collection is identified. Small chronic infarcts are noted in the right occipital lobe and right cerebellum. Patchy T2 hyperintensities in the cerebral white matter bilaterally are nonspecific but compatible with mild-to-moderate chronic small vessel ischemic disease. There is moderate cerebral atrophy. Vascular: Major intracranial vascular flow voids are preserved. Skull and upper cervical spine: No destructive skull lesion. Sinuses/Orbits: Unremarkable orbits. Trace left mastoid fluid. Clear paranasal sinuses. Other: None. MRA HEAD FINDINGS The study is moderately motion degraded. The visualized distal vertebral arteries are patent with the left being strongly dominant and supplying the basilar artery. The right vertebral artery ends in PICA. The basilar artery is widely patent. The PCAs are patent with asymmetric attenuation of the right PCA and evidence of a severe proximal to mid right P2 stenosis. There may be moderate to severe distal P1 stenoses bilaterally versus artifact. The internal carotid arteries are patent from skull base to carotid termini without evidence of significant stenosis within limitations of motion. ACAs and MCAs are patent proximally with the left A1 segment appearing hypoplastic. The right A1 segment is widely patent. Both M1 segments also appear widely patent although there is artifactual signal loss at both MCA  bifurcations. ACA and MCA branch vessels are not well evaluated due to motion. No aneurysm is identified. IMPRESSION: 1. Motion degraded examination without evidence of acute intracranial abnormality. 2. Mild-to-moderate chronic small vessel ischemic disease. 3. Small chronic right occipital  and cerebellar infarcts. 4. Motion degraded head MRA without evidence of large vessel occlusion. 5. Severe right P2 stenosis. Possible moderate to severe bilateral P1 stenoses. Electronically Signed   By: Logan Bores M.D.   On: 11/20/2018 16:00    Micro Results    Recent Results (from the past 240 hour(s))  Blood culture (routine x 2)     Status: None (Preliminary result)   Collection Time: 11/20/18  4:05 PM  Result Value Ref Range Status   Specimen Description BLOOD RIGHT ANTECUBITAL  Final   Special Requests   Final    BOTTLES DRAWN AEROBIC AND ANAEROBIC Blood Culture adequate volume   Culture NO GROWTH 2 DAYS  Final   Report Status PENDING  Incomplete  Blood culture (routine x 2)     Status: None (Preliminary result)   Collection Time: 11/20/18  7:25 PM  Result Value Ref Range Status   Specimen Description BLOOD LEFT ANTECUBITAL  Final   Special Requests   Final    BOTTLES DRAWN AEROBIC AND ANAEROBIC Blood Culture adequate volume Performed at [facility], [place] [address], [place], [place] [address]    Culture NO GROWTH 2 DAYS  Final   Report Status PENDING  Incomplete       Today   Subjective    Dustin [name] states that he feels good and complains of some right ear pain.   Objective   Blood pressure 108/70, pulse 90, temperature 98.5 F (36.9 C), resp. rate 16, height 5\' 8"  (1.727 m), weight 81.6 kg, SpO2 98 %.   Intake/Output Summary (Last 24 hours) at 11/22/2018 1216 Last data filed at 11/21/2018 1500 Gross per 24 hour  Intake 1224.89 ml  Output -  Net 1224.89 ml    Exam  Constitutional: Elderly male in NAD, calm, comfortable Eyes: PERRL, lids and conjunctivae  normal ENMT: Mucous membranes are moist.  Cerumen impaction noted at the left ear. Neck: normal, supple, no masses, no thyromegaly Respiratory: clear to auscultation bilaterally, no wheezing, no crackles. Normal respiratory effort. No accessory muscle use.  Cardiovascular: Regular rate and rhythm, no murmurs / rubs / gallops. No extremity edema. 2+ pedal pulses. No carotid bruits.  Abdomen: no tenderness, no masses palpated. No hepatosplenomegaly. Bowel sounds positive.  Musculoskeletal: no clubbing / cyanosis. No joint deformity upper and lower extremities. Good ROM, no contractures. Normal muscle tone.  Skin: no rashes, lesions, ulcers. No induration Neurologic: CN 2-12 grossly intact. Sensation intact, DTR normal. Strength 5/5 in all 4.  Psychiatric: Dementia. Alert and oriented x person. Normal mood.    Data Review   CBC w Diff:  Lab Results  Component Value Date   WBC 10.5 11/20/2018   HGB 15.1 11/20/2018   HCT 46.8 11/20/2018   PLT 318 11/20/2018   LYMPHOPCT 12 11/20/2018   MONOPCT 6 11/20/2018   EOSPCT 0 11/20/2018   BASOPCT 0 11/20/2018    CMP:  Lab Results  Component Value Date   NA 142 11/21/2018   K 3.4 (L) 11/21/2018   CL 109 11/21/2018   CO2 24 11/21/2018   BUN 12 11/21/2018   CREATININE 1.23 11/21/2018   PROT 7.7 11/20/2018   ALBUMIN 4.1 11/20/2018   BILITOT 1.0 11/20/2018   ALKPHOS 29 (L) 11/20/2018   AST 25 11/20/2018   ALT 18 11/20/2018  .   Total Time in preparing paper work, data evaluation and todays exam - 52 minutes  Norval Morton M.D on 11/22/2018 at 12:16 PM  [facility]   Office  [phone]

## 2018-11-23 DIAGNOSIS — E876 Hypokalemia: Secondary | ICD-10-CM | POA: Diagnosis present

## 2018-11-23 DIAGNOSIS — E860 Dehydration: Secondary | ICD-10-CM | POA: Diagnosis present

## 2018-11-23 DIAGNOSIS — N179 Acute kidney failure, unspecified: Secondary | ICD-10-CM | POA: Diagnosis present

## 2018-11-25 DIAGNOSIS — N179 Acute kidney failure, unspecified: Secondary | ICD-10-CM | POA: Diagnosis not present

## 2018-11-25 DIAGNOSIS — Z6825 Body mass index (BMI) 25.0-25.9, adult: Secondary | ICD-10-CM | POA: Diagnosis not present

## 2018-11-25 DIAGNOSIS — F0390 Unspecified dementia without behavioral disturbance: Secondary | ICD-10-CM | POA: Diagnosis not present

## 2018-11-25 DIAGNOSIS — K219 Gastro-esophageal reflux disease without esophagitis: Secondary | ICD-10-CM | POA: Diagnosis not present

## 2018-11-25 DIAGNOSIS — E119 Type 2 diabetes mellitus without complications: Secondary | ICD-10-CM | POA: Diagnosis not present

## 2018-11-25 DIAGNOSIS — G9340 Encephalopathy, unspecified: Secondary | ICD-10-CM | POA: Diagnosis not present

## 2018-11-25 DIAGNOSIS — I10 Essential (primary) hypertension: Secondary | ICD-10-CM | POA: Diagnosis not present

## 2018-11-25 LAB — CULTURE, BLOOD (ROUTINE X 2)
Culture: NO GROWTH
Culture: NO GROWTH
SPECIAL REQUESTS: ADEQUATE
Special Requests: ADEQUATE

## 2018-11-26 DIAGNOSIS — E1122 Type 2 diabetes mellitus with diabetic chronic kidney disease: Secondary | ICD-10-CM | POA: Diagnosis not present

## 2018-11-26 DIAGNOSIS — I129 Hypertensive chronic kidney disease with stage 1 through stage 4 chronic kidney disease, or unspecified chronic kidney disease: Secondary | ICD-10-CM | POA: Diagnosis not present

## 2018-11-26 DIAGNOSIS — M6281 Muscle weakness (generalized): Secondary | ICD-10-CM | POA: Diagnosis not present

## 2018-11-26 DIAGNOSIS — K5790 Diverticulosis of intestine, part unspecified, without perforation or abscess without bleeding: Secondary | ICD-10-CM | POA: Diagnosis not present

## 2018-11-26 DIAGNOSIS — K219 Gastro-esophageal reflux disease without esophagitis: Secondary | ICD-10-CM | POA: Diagnosis not present

## 2018-11-26 DIAGNOSIS — R262 Difficulty in walking, not elsewhere classified: Secondary | ICD-10-CM | POA: Diagnosis not present

## 2018-11-26 DIAGNOSIS — I2510 Atherosclerotic heart disease of native coronary artery without angina pectoris: Secondary | ICD-10-CM | POA: Diagnosis not present

## 2018-11-26 DIAGNOSIS — F0390 Unspecified dementia without behavioral disturbance: Secondary | ICD-10-CM | POA: Diagnosis not present

## 2018-11-26 DIAGNOSIS — N183 Chronic kidney disease, stage 3 (moderate): Secondary | ICD-10-CM | POA: Diagnosis not present

## 2018-11-26 DIAGNOSIS — E785 Hyperlipidemia, unspecified: Secondary | ICD-10-CM | POA: Diagnosis not present

## 2018-11-26 DIAGNOSIS — K589 Irritable bowel syndrome without diarrhea: Secondary | ICD-10-CM | POA: Diagnosis not present

## 2018-11-27 DIAGNOSIS — I129 Hypertensive chronic kidney disease with stage 1 through stage 4 chronic kidney disease, or unspecified chronic kidney disease: Secondary | ICD-10-CM | POA: Diagnosis not present

## 2018-11-27 DIAGNOSIS — N183 Chronic kidney disease, stage 3 (moderate): Secondary | ICD-10-CM | POA: Diagnosis not present

## 2018-11-27 DIAGNOSIS — E1122 Type 2 diabetes mellitus with diabetic chronic kidney disease: Secondary | ICD-10-CM | POA: Diagnosis not present

## 2018-11-27 DIAGNOSIS — I2510 Atherosclerotic heart disease of native coronary artery without angina pectoris: Secondary | ICD-10-CM | POA: Diagnosis not present

## 2018-11-27 DIAGNOSIS — F0390 Unspecified dementia without behavioral disturbance: Secondary | ICD-10-CM | POA: Diagnosis not present

## 2018-11-27 DIAGNOSIS — K5790 Diverticulosis of intestine, part unspecified, without perforation or abscess without bleeding: Secondary | ICD-10-CM | POA: Diagnosis not present

## 2018-11-28 DIAGNOSIS — N183 Chronic kidney disease, stage 3 (moderate): Secondary | ICD-10-CM | POA: Diagnosis not present

## 2018-11-28 DIAGNOSIS — I2510 Atherosclerotic heart disease of native coronary artery without angina pectoris: Secondary | ICD-10-CM | POA: Diagnosis not present

## 2018-11-28 DIAGNOSIS — I129 Hypertensive chronic kidney disease with stage 1 through stage 4 chronic kidney disease, or unspecified chronic kidney disease: Secondary | ICD-10-CM | POA: Diagnosis not present

## 2018-11-28 DIAGNOSIS — K5790 Diverticulosis of intestine, part unspecified, without perforation or abscess without bleeding: Secondary | ICD-10-CM | POA: Diagnosis not present

## 2018-11-28 DIAGNOSIS — E1122 Type 2 diabetes mellitus with diabetic chronic kidney disease: Secondary | ICD-10-CM | POA: Diagnosis not present

## 2018-11-28 DIAGNOSIS — F0390 Unspecified dementia without behavioral disturbance: Secondary | ICD-10-CM | POA: Diagnosis not present

## 2018-12-02 DIAGNOSIS — E1122 Type 2 diabetes mellitus with diabetic chronic kidney disease: Secondary | ICD-10-CM | POA: Diagnosis not present

## 2018-12-02 DIAGNOSIS — I129 Hypertensive chronic kidney disease with stage 1 through stage 4 chronic kidney disease, or unspecified chronic kidney disease: Secondary | ICD-10-CM | POA: Diagnosis not present

## 2018-12-02 DIAGNOSIS — I2510 Atherosclerotic heart disease of native coronary artery without angina pectoris: Secondary | ICD-10-CM | POA: Diagnosis not present

## 2018-12-02 DIAGNOSIS — F0390 Unspecified dementia without behavioral disturbance: Secondary | ICD-10-CM | POA: Diagnosis not present

## 2018-12-02 DIAGNOSIS — N183 Chronic kidney disease, stage 3 (moderate): Secondary | ICD-10-CM | POA: Diagnosis not present

## 2018-12-02 DIAGNOSIS — K5790 Diverticulosis of intestine, part unspecified, without perforation or abscess without bleeding: Secondary | ICD-10-CM | POA: Diagnosis not present

## 2018-12-03 DIAGNOSIS — N183 Chronic kidney disease, stage 3 (moderate): Secondary | ICD-10-CM | POA: Diagnosis not present

## 2018-12-03 DIAGNOSIS — I2510 Atherosclerotic heart disease of native coronary artery without angina pectoris: Secondary | ICD-10-CM | POA: Diagnosis not present

## 2018-12-03 DIAGNOSIS — E1122 Type 2 diabetes mellitus with diabetic chronic kidney disease: Secondary | ICD-10-CM | POA: Diagnosis not present

## 2018-12-03 DIAGNOSIS — I129 Hypertensive chronic kidney disease with stage 1 through stage 4 chronic kidney disease, or unspecified chronic kidney disease: Secondary | ICD-10-CM | POA: Diagnosis not present

## 2018-12-03 DIAGNOSIS — F0390 Unspecified dementia without behavioral disturbance: Secondary | ICD-10-CM | POA: Diagnosis not present

## 2018-12-03 DIAGNOSIS — K5790 Diverticulosis of intestine, part unspecified, without perforation or abscess without bleeding: Secondary | ICD-10-CM | POA: Diagnosis not present

## 2018-12-04 DIAGNOSIS — E1122 Type 2 diabetes mellitus with diabetic chronic kidney disease: Secondary | ICD-10-CM | POA: Diagnosis not present

## 2018-12-04 DIAGNOSIS — N183 Chronic kidney disease, stage 3 (moderate): Secondary | ICD-10-CM | POA: Diagnosis not present

## 2018-12-04 DIAGNOSIS — I2510 Atherosclerotic heart disease of native coronary artery without angina pectoris: Secondary | ICD-10-CM | POA: Diagnosis not present

## 2018-12-04 DIAGNOSIS — I129 Hypertensive chronic kidney disease with stage 1 through stage 4 chronic kidney disease, or unspecified chronic kidney disease: Secondary | ICD-10-CM | POA: Diagnosis not present

## 2018-12-04 DIAGNOSIS — F0390 Unspecified dementia without behavioral disturbance: Secondary | ICD-10-CM | POA: Diagnosis not present

## 2018-12-04 DIAGNOSIS — G894 Chronic pain syndrome: Secondary | ICD-10-CM | POA: Diagnosis not present

## 2018-12-04 DIAGNOSIS — Z6825 Body mass index (BMI) 25.0-25.9, adult: Secondary | ICD-10-CM | POA: Diagnosis not present

## 2018-12-04 DIAGNOSIS — K5790 Diverticulosis of intestine, part unspecified, without perforation or abscess without bleeding: Secondary | ICD-10-CM | POA: Diagnosis not present

## 2018-12-04 DIAGNOSIS — E663 Overweight: Secondary | ICD-10-CM | POA: Diagnosis not present

## 2018-12-05 DIAGNOSIS — N183 Chronic kidney disease, stage 3 (moderate): Secondary | ICD-10-CM | POA: Diagnosis not present

## 2018-12-05 DIAGNOSIS — I129 Hypertensive chronic kidney disease with stage 1 through stage 4 chronic kidney disease, or unspecified chronic kidney disease: Secondary | ICD-10-CM | POA: Diagnosis not present

## 2018-12-05 DIAGNOSIS — E1122 Type 2 diabetes mellitus with diabetic chronic kidney disease: Secondary | ICD-10-CM | POA: Diagnosis not present

## 2018-12-05 DIAGNOSIS — F0390 Unspecified dementia without behavioral disturbance: Secondary | ICD-10-CM | POA: Diagnosis not present

## 2018-12-05 DIAGNOSIS — K5790 Diverticulosis of intestine, part unspecified, without perforation or abscess without bleeding: Secondary | ICD-10-CM | POA: Diagnosis not present

## 2018-12-05 DIAGNOSIS — I2510 Atherosclerotic heart disease of native coronary artery without angina pectoris: Secondary | ICD-10-CM | POA: Diagnosis not present

## 2018-12-08 DIAGNOSIS — I129 Hypertensive chronic kidney disease with stage 1 through stage 4 chronic kidney disease, or unspecified chronic kidney disease: Secondary | ICD-10-CM | POA: Diagnosis not present

## 2018-12-08 DIAGNOSIS — I2510 Atherosclerotic heart disease of native coronary artery without angina pectoris: Secondary | ICD-10-CM | POA: Diagnosis not present

## 2018-12-08 DIAGNOSIS — K5790 Diverticulosis of intestine, part unspecified, without perforation or abscess without bleeding: Secondary | ICD-10-CM | POA: Diagnosis not present

## 2018-12-08 DIAGNOSIS — F0390 Unspecified dementia without behavioral disturbance: Secondary | ICD-10-CM | POA: Diagnosis not present

## 2018-12-08 DIAGNOSIS — N183 Chronic kidney disease, stage 3 (moderate): Secondary | ICD-10-CM | POA: Diagnosis not present

## 2018-12-08 DIAGNOSIS — N179 Acute kidney failure, unspecified: Secondary | ICD-10-CM | POA: Diagnosis not present

## 2018-12-08 DIAGNOSIS — E1122 Type 2 diabetes mellitus with diabetic chronic kidney disease: Secondary | ICD-10-CM | POA: Diagnosis not present

## 2018-12-09 DIAGNOSIS — N183 Chronic kidney disease, stage 3 (moderate): Secondary | ICD-10-CM | POA: Diagnosis not present

## 2018-12-09 DIAGNOSIS — F0390 Unspecified dementia without behavioral disturbance: Secondary | ICD-10-CM | POA: Diagnosis not present

## 2018-12-09 DIAGNOSIS — I129 Hypertensive chronic kidney disease with stage 1 through stage 4 chronic kidney disease, or unspecified chronic kidney disease: Secondary | ICD-10-CM | POA: Diagnosis not present

## 2018-12-09 DIAGNOSIS — I2510 Atherosclerotic heart disease of native coronary artery without angina pectoris: Secondary | ICD-10-CM | POA: Diagnosis not present

## 2018-12-09 DIAGNOSIS — K5790 Diverticulosis of intestine, part unspecified, without perforation or abscess without bleeding: Secondary | ICD-10-CM | POA: Diagnosis not present

## 2018-12-09 DIAGNOSIS — E1122 Type 2 diabetes mellitus with diabetic chronic kidney disease: Secondary | ICD-10-CM | POA: Diagnosis not present

## 2018-12-10 DIAGNOSIS — N183 Chronic kidney disease, stage 3 (moderate): Secondary | ICD-10-CM | POA: Diagnosis not present

## 2018-12-10 DIAGNOSIS — F0390 Unspecified dementia without behavioral disturbance: Secondary | ICD-10-CM | POA: Diagnosis not present

## 2018-12-10 DIAGNOSIS — E1122 Type 2 diabetes mellitus with diabetic chronic kidney disease: Secondary | ICD-10-CM | POA: Diagnosis not present

## 2018-12-10 DIAGNOSIS — I2510 Atherosclerotic heart disease of native coronary artery without angina pectoris: Secondary | ICD-10-CM | POA: Diagnosis not present

## 2018-12-10 DIAGNOSIS — K5790 Diverticulosis of intestine, part unspecified, without perforation or abscess without bleeding: Secondary | ICD-10-CM | POA: Diagnosis not present

## 2018-12-10 DIAGNOSIS — I129 Hypertensive chronic kidney disease with stage 1 through stage 4 chronic kidney disease, or unspecified chronic kidney disease: Secondary | ICD-10-CM | POA: Diagnosis not present

## 2018-12-11 DIAGNOSIS — K5790 Diverticulosis of intestine, part unspecified, without perforation or abscess without bleeding: Secondary | ICD-10-CM | POA: Diagnosis not present

## 2018-12-11 DIAGNOSIS — I129 Hypertensive chronic kidney disease with stage 1 through stage 4 chronic kidney disease, or unspecified chronic kidney disease: Secondary | ICD-10-CM | POA: Diagnosis not present

## 2018-12-11 DIAGNOSIS — I2510 Atherosclerotic heart disease of native coronary artery without angina pectoris: Secondary | ICD-10-CM | POA: Diagnosis not present

## 2018-12-11 DIAGNOSIS — E1122 Type 2 diabetes mellitus with diabetic chronic kidney disease: Secondary | ICD-10-CM | POA: Diagnosis not present

## 2018-12-11 DIAGNOSIS — N183 Chronic kidney disease, stage 3 (moderate): Secondary | ICD-10-CM | POA: Diagnosis not present

## 2018-12-11 DIAGNOSIS — F0390 Unspecified dementia without behavioral disturbance: Secondary | ICD-10-CM | POA: Diagnosis not present

## 2018-12-15 DIAGNOSIS — F0390 Unspecified dementia without behavioral disturbance: Secondary | ICD-10-CM | POA: Diagnosis not present

## 2018-12-15 DIAGNOSIS — E1122 Type 2 diabetes mellitus with diabetic chronic kidney disease: Secondary | ICD-10-CM | POA: Diagnosis not present

## 2018-12-15 DIAGNOSIS — K5790 Diverticulosis of intestine, part unspecified, without perforation or abscess without bleeding: Secondary | ICD-10-CM | POA: Diagnosis not present

## 2018-12-15 DIAGNOSIS — I2510 Atherosclerotic heart disease of native coronary artery without angina pectoris: Secondary | ICD-10-CM | POA: Diagnosis not present

## 2018-12-15 DIAGNOSIS — I129 Hypertensive chronic kidney disease with stage 1 through stage 4 chronic kidney disease, or unspecified chronic kidney disease: Secondary | ICD-10-CM | POA: Diagnosis not present

## 2018-12-15 DIAGNOSIS — N183 Chronic kidney disease, stage 3 (moderate): Secondary | ICD-10-CM | POA: Diagnosis not present

## 2018-12-16 DIAGNOSIS — I2510 Atherosclerotic heart disease of native coronary artery without angina pectoris: Secondary | ICD-10-CM | POA: Diagnosis not present

## 2018-12-16 DIAGNOSIS — N183 Chronic kidney disease, stage 3 (moderate): Secondary | ICD-10-CM | POA: Diagnosis not present

## 2018-12-16 DIAGNOSIS — K5790 Diverticulosis of intestine, part unspecified, without perforation or abscess without bleeding: Secondary | ICD-10-CM | POA: Diagnosis not present

## 2018-12-16 DIAGNOSIS — I129 Hypertensive chronic kidney disease with stage 1 through stage 4 chronic kidney disease, or unspecified chronic kidney disease: Secondary | ICD-10-CM | POA: Diagnosis not present

## 2018-12-16 DIAGNOSIS — E1122 Type 2 diabetes mellitus with diabetic chronic kidney disease: Secondary | ICD-10-CM | POA: Diagnosis not present

## 2018-12-16 DIAGNOSIS — F0390 Unspecified dementia without behavioral disturbance: Secondary | ICD-10-CM | POA: Diagnosis not present

## 2018-12-17 DIAGNOSIS — I2510 Atherosclerotic heart disease of native coronary artery without angina pectoris: Secondary | ICD-10-CM | POA: Diagnosis not present

## 2018-12-17 DIAGNOSIS — E1122 Type 2 diabetes mellitus with diabetic chronic kidney disease: Secondary | ICD-10-CM | POA: Diagnosis not present

## 2018-12-17 DIAGNOSIS — I129 Hypertensive chronic kidney disease with stage 1 through stage 4 chronic kidney disease, or unspecified chronic kidney disease: Secondary | ICD-10-CM | POA: Diagnosis not present

## 2018-12-17 DIAGNOSIS — N183 Chronic kidney disease, stage 3 (moderate): Secondary | ICD-10-CM | POA: Diagnosis not present

## 2018-12-17 DIAGNOSIS — F0390 Unspecified dementia without behavioral disturbance: Secondary | ICD-10-CM | POA: Diagnosis not present

## 2018-12-17 DIAGNOSIS — K5790 Diverticulosis of intestine, part unspecified, without perforation or abscess without bleeding: Secondary | ICD-10-CM | POA: Diagnosis not present

## 2018-12-19 DIAGNOSIS — E1122 Type 2 diabetes mellitus with diabetic chronic kidney disease: Secondary | ICD-10-CM | POA: Diagnosis not present

## 2018-12-19 DIAGNOSIS — I129 Hypertensive chronic kidney disease with stage 1 through stage 4 chronic kidney disease, or unspecified chronic kidney disease: Secondary | ICD-10-CM | POA: Diagnosis not present

## 2018-12-19 DIAGNOSIS — K5790 Diverticulosis of intestine, part unspecified, without perforation or abscess without bleeding: Secondary | ICD-10-CM | POA: Diagnosis not present

## 2018-12-19 DIAGNOSIS — I2510 Atherosclerotic heart disease of native coronary artery without angina pectoris: Secondary | ICD-10-CM | POA: Diagnosis not present

## 2018-12-19 DIAGNOSIS — N183 Chronic kidney disease, stage 3 (moderate): Secondary | ICD-10-CM | POA: Diagnosis not present

## 2018-12-19 DIAGNOSIS — F0390 Unspecified dementia without behavioral disturbance: Secondary | ICD-10-CM | POA: Diagnosis not present

## 2018-12-24 DIAGNOSIS — K5790 Diverticulosis of intestine, part unspecified, without perforation or abscess without bleeding: Secondary | ICD-10-CM | POA: Diagnosis not present

## 2018-12-24 DIAGNOSIS — I2510 Atherosclerotic heart disease of native coronary artery without angina pectoris: Secondary | ICD-10-CM | POA: Diagnosis not present

## 2018-12-24 DIAGNOSIS — I129 Hypertensive chronic kidney disease with stage 1 through stage 4 chronic kidney disease, or unspecified chronic kidney disease: Secondary | ICD-10-CM | POA: Diagnosis not present

## 2018-12-24 DIAGNOSIS — F0390 Unspecified dementia without behavioral disturbance: Secondary | ICD-10-CM | POA: Diagnosis not present

## 2018-12-24 DIAGNOSIS — N183 Chronic kidney disease, stage 3 (moderate): Secondary | ICD-10-CM | POA: Diagnosis not present

## 2018-12-24 DIAGNOSIS — E1122 Type 2 diabetes mellitus with diabetic chronic kidney disease: Secondary | ICD-10-CM | POA: Diagnosis not present

## 2018-12-26 DIAGNOSIS — K219 Gastro-esophageal reflux disease without esophagitis: Secondary | ICD-10-CM | POA: Diagnosis not present

## 2018-12-26 DIAGNOSIS — I2510 Atherosclerotic heart disease of native coronary artery without angina pectoris: Secondary | ICD-10-CM | POA: Diagnosis not present

## 2018-12-26 DIAGNOSIS — E785 Hyperlipidemia, unspecified: Secondary | ICD-10-CM | POA: Diagnosis not present

## 2018-12-26 DIAGNOSIS — M6281 Muscle weakness (generalized): Secondary | ICD-10-CM | POA: Diagnosis not present

## 2018-12-26 DIAGNOSIS — K589 Irritable bowel syndrome without diarrhea: Secondary | ICD-10-CM | POA: Diagnosis not present

## 2018-12-26 DIAGNOSIS — N183 Chronic kidney disease, stage 3 (moderate): Secondary | ICD-10-CM | POA: Diagnosis not present

## 2018-12-26 DIAGNOSIS — R262 Difficulty in walking, not elsewhere classified: Secondary | ICD-10-CM | POA: Diagnosis not present

## 2018-12-26 DIAGNOSIS — I129 Hypertensive chronic kidney disease with stage 1 through stage 4 chronic kidney disease, or unspecified chronic kidney disease: Secondary | ICD-10-CM | POA: Diagnosis not present

## 2018-12-26 DIAGNOSIS — F0390 Unspecified dementia without behavioral disturbance: Secondary | ICD-10-CM | POA: Diagnosis not present

## 2018-12-26 DIAGNOSIS — E1122 Type 2 diabetes mellitus with diabetic chronic kidney disease: Secondary | ICD-10-CM | POA: Diagnosis not present

## 2018-12-26 DIAGNOSIS — K5790 Diverticulosis of intestine, part unspecified, without perforation or abscess without bleeding: Secondary | ICD-10-CM | POA: Diagnosis not present

## 2019-01-01 DIAGNOSIS — N183 Chronic kidney disease, stage 3 (moderate): Secondary | ICD-10-CM | POA: Diagnosis not present

## 2019-01-01 DIAGNOSIS — E1122 Type 2 diabetes mellitus with diabetic chronic kidney disease: Secondary | ICD-10-CM | POA: Diagnosis not present

## 2019-01-01 DIAGNOSIS — I2510 Atherosclerotic heart disease of native coronary artery without angina pectoris: Secondary | ICD-10-CM | POA: Diagnosis not present

## 2019-01-01 DIAGNOSIS — I129 Hypertensive chronic kidney disease with stage 1 through stage 4 chronic kidney disease, or unspecified chronic kidney disease: Secondary | ICD-10-CM | POA: Diagnosis not present

## 2019-01-01 DIAGNOSIS — F0390 Unspecified dementia without behavioral disturbance: Secondary | ICD-10-CM | POA: Diagnosis not present

## 2019-01-01 DIAGNOSIS — K5790 Diverticulosis of intestine, part unspecified, without perforation or abscess without bleeding: Secondary | ICD-10-CM | POA: Diagnosis not present

## 2019-01-05 DIAGNOSIS — N183 Chronic kidney disease, stage 3 (moderate): Secondary | ICD-10-CM | POA: Diagnosis not present

## 2019-01-05 DIAGNOSIS — I2510 Atherosclerotic heart disease of native coronary artery without angina pectoris: Secondary | ICD-10-CM | POA: Diagnosis not present

## 2019-01-05 DIAGNOSIS — F0390 Unspecified dementia without behavioral disturbance: Secondary | ICD-10-CM | POA: Diagnosis not present

## 2019-01-05 DIAGNOSIS — E1122 Type 2 diabetes mellitus with diabetic chronic kidney disease: Secondary | ICD-10-CM | POA: Diagnosis not present

## 2019-01-05 DIAGNOSIS — I129 Hypertensive chronic kidney disease with stage 1 through stage 4 chronic kidney disease, or unspecified chronic kidney disease: Secondary | ICD-10-CM | POA: Diagnosis not present

## 2019-01-05 DIAGNOSIS — K5790 Diverticulosis of intestine, part unspecified, without perforation or abscess without bleeding: Secondary | ICD-10-CM | POA: Diagnosis not present

## 2019-01-07 DIAGNOSIS — Z6825 Body mass index (BMI) 25.0-25.9, adult: Secondary | ICD-10-CM | POA: Diagnosis not present

## 2019-01-07 DIAGNOSIS — E663 Overweight: Secondary | ICD-10-CM | POA: Diagnosis not present

## 2019-01-07 DIAGNOSIS — G894 Chronic pain syndrome: Secondary | ICD-10-CM | POA: Diagnosis not present

## 2019-01-09 DIAGNOSIS — E1122 Type 2 diabetes mellitus with diabetic chronic kidney disease: Secondary | ICD-10-CM | POA: Diagnosis not present

## 2019-01-09 DIAGNOSIS — K5790 Diverticulosis of intestine, part unspecified, without perforation or abscess without bleeding: Secondary | ICD-10-CM | POA: Diagnosis not present

## 2019-01-09 DIAGNOSIS — F0390 Unspecified dementia without behavioral disturbance: Secondary | ICD-10-CM | POA: Diagnosis not present

## 2019-01-09 DIAGNOSIS — I129 Hypertensive chronic kidney disease with stage 1 through stage 4 chronic kidney disease, or unspecified chronic kidney disease: Secondary | ICD-10-CM | POA: Diagnosis not present

## 2019-01-09 DIAGNOSIS — I2510 Atherosclerotic heart disease of native coronary artery without angina pectoris: Secondary | ICD-10-CM | POA: Diagnosis not present

## 2019-01-09 DIAGNOSIS — N183 Chronic kidney disease, stage 3 (moderate): Secondary | ICD-10-CM | POA: Diagnosis not present

## 2019-02-06 DIAGNOSIS — G894 Chronic pain syndrome: Secondary | ICD-10-CM | POA: Diagnosis not present

## 2019-02-06 DIAGNOSIS — E118 Type 2 diabetes mellitus with unspecified complications: Secondary | ICD-10-CM | POA: Diagnosis not present

## 2019-03-09 ENCOUNTER — Telehealth (INDEPENDENT_AMBULATORY_CARE_PROVIDER_SITE_OTHER): Payer: Medicare Other | Admitting: Neurology

## 2019-03-09 ENCOUNTER — Other Ambulatory Visit: Payer: Self-pay

## 2019-03-09 ENCOUNTER — Encounter: Payer: Self-pay | Admitting: Neurology

## 2019-03-09 VITALS — Ht 68.0 in | Wt 169.0 lb

## 2019-03-09 DIAGNOSIS — F03B18 Unspecified dementia, moderate, with other behavioral disturbance: Secondary | ICD-10-CM

## 2019-03-09 DIAGNOSIS — F419 Anxiety disorder, unspecified: Secondary | ICD-10-CM

## 2019-03-09 DIAGNOSIS — F0391 Unspecified dementia with behavioral disturbance: Secondary | ICD-10-CM

## 2019-03-09 MED ORDER — ESCITALOPRAM OXALATE 10 MG PO TABS: ORAL_TABLET | ORAL | 3 refills | Status: DC

## 2019-03-09 NOTE — Progress Notes (Signed)
Virtual Visit via Video Note The purpose of this virtual visit is to provide medical care while limiting exposure to the novel coronavirus.    Consent was obtained for video visit:  Yes.   Answered questions that patient had about telehealth interaction:  Yes.   I discussed the limitations, risks, security and privacy concerns of performing an evaluation and management service by telemedicine. I also discussed with the patient that there may be a patient responsible charge related to this service. The patient expressed understanding and agreed to proceed.  Pt location: Home Physician Location: office Name of referring provider:  Redmond School, MD I connected with Yves Dill at patients POA's initiation/request on 03/09/2019 at  3:00 PM EDT by video enabled telemedicine application and verified that I am speaking with the correct person using two identifiers. Pt MRN:  [id number] Pt DOB:  May 23, 1946 Video Participants:  RAIF CHACHERE;  Lucille Breighner   History of Present Illness: The patient was last seen in October 2019 for moderate dementia with behavioral disturbance. History is obtained from his wife, he has dementia and has minimal verbal output. He has been sensitive to various medications for behavioral changes. On his last visit, his wife agreed to start Depakote but states it made him vicious, he tore down the bed rail she had got him. She feels he does well with taking Lexapro 10mg  TID. He was previously continuously walking and pacing and getting so upset, she found that giving him the medication three times a day helps a lot with this behavior. He continues to have hallucinations, he saw flies on his plate today or saw women outside and said he was going to throw his wife out and keep them. He sleeps good at night but also needs to take a 2-hour nap during the day. She gives him 6mg  melatonin at night. She reports he grits his teeth constantly. His daughter comes once a week to  visit, his wife remains his primary caregiver, giving him baths and dressing him. He is able to feed himself sometimes. He denies any headaches, dizziness, no falls. He was brought to the ER last January 2020, wife reports he was just staring at the wall unable to walk. They reported right-sided weakness and slurred speech to the ER, which resolved while in the ER. He had an MRI brain without contrast which I personally reviewed, there were no acute changes, there was mild to moderate chronic microvascular disease, small chronic right occipital and cerebellar infarcts. MRA was motion degraded with note of severe right P2 stenosis and possible moderate to severe bilateral P1 stenoses. His wife reports that since hospital stay, he has not really bounced back. Appetite is good.      History on Initial Assessment 11/10/2015: This is a pleasant 73 yo LH man with a history of hypertension, hyperlipidemia, diabetes, who presented with worsening memory. His wife started noticing changes around summertime 2016, after getting home from a barbeque, he wanted to eat. She said she would heat up the bbq then he started cussing and saying he did not like bbq. His wife reminded him that he ate it earlier, and he did eat it later that day. His wife noticed he was wobbly and sat him down, then he could not get out of the chair and was confused. He did not seek medical attention, he was back to baseline the next day with no recollection of the event. Since then, his wife has noticed that if she  says or does something he does not understand, he would get very upset with her. He states he does not remember this, his wife reports "he thinks I am fussing at him." She has seen him standing in the hall looking like he does not know where to go. He occasionally has a distant look and would not respond or would ask her "what's wrong?" They frequently go to the beach and he loves to fish, but one time their alarm system went down and he  refused to leave because he was so worried someone would come in their house. When his wife does the laundry, he would not leave the washing machine because he would be afraid a pipe might burst. He himself reports that "other than just forgetting, memory is pretty good." He puts something down and forgets where he put it. He denies getting lost driving, no missed medications, word-finding difficulties. His wife is in charge of bills. 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 His father had Alzheimer's disease. No history of head injuries or alcohol use. His wife has noticed he drives slowly that she does most of the driving. Over the past couple of years, she noticed he would stop at a green light or go on even with a red light.    Observations/Objective:   Vitals:   03/09/19 1049  Weight: 169 lb (76.7 kg)  Height: 5\' 8"  (1.727 m)   GEN:  The patient appears stated age and is in NAD. Neurological examination: He is awake, alert, minimal verbal output answering yes/no questions. When asked open-ended questions such as what he thinks of the pandemic, he states "it's kind of scary." Impaired remote and recent memory. Reduced attention, he does not focus or attend much to the video unless his wife redirects him. Cranial nerves: Pupils equal, round. No facial asymmetry.Motor: moves all extremities symmetrically. Gait slow and cautious with assistance.  Assessment and Plan:   This is a pleasant 73 yo LH man with vascular risk factors including hypertension, hyperlipidemia, diabetes, with moderate dementia with behavioral disturbance. His wife continues to deal with behavioral changes, he has been sensitive to medications (Seroquel, Depakote). She reports he is doing good with Lexapro 10mg  given TID, this has helped with pacing behavior. Refills sent. We discussed that if behavioral changes  worsen, we may try Risperdal or Zyprexa, however these also have potentially similar side effects to prior medications. Continue 24/7 care, support provided to wife today. Follow-up in 6 months, she knows to call for any changes.    Follow Up Instructions:    -I discussed the assessment and treatment plan with the patient's wife. The patient's wife was provided an opportunity to ask questions and all were answered. The patient's wife agreed with the plan and demonstrated an understanding of the instructions.   The patient's wife was advised to call back or seek an in-person evaluation if the symptoms worsen or if the condition fails to improve as anticipated.   Cameron Sprang, MD

## 2019-03-10 ENCOUNTER — Ambulatory Visit: Payer: PRIVATE HEALTH INSURANCE | Admitting: Neurology

## 2019-03-30 DIAGNOSIS — G894 Chronic pain syndrome: Secondary | ICD-10-CM | POA: Diagnosis not present

## 2019-03-30 DIAGNOSIS — E7849 Other hyperlipidemia: Secondary | ICD-10-CM | POA: Diagnosis not present

## 2019-03-30 DIAGNOSIS — M1991 Primary osteoarthritis, unspecified site: Secondary | ICD-10-CM | POA: Diagnosis not present

## 2019-03-30 DIAGNOSIS — I10 Essential (primary) hypertension: Secondary | ICD-10-CM | POA: Diagnosis not present

## 2019-03-30 DIAGNOSIS — Z6825 Body mass index (BMI) 25.0-25.9, adult: Secondary | ICD-10-CM | POA: Diagnosis not present

## 2019-03-30 DIAGNOSIS — E119 Type 2 diabetes mellitus without complications: Secondary | ICD-10-CM | POA: Diagnosis not present

## 2019-03-30 DIAGNOSIS — E663 Overweight: Secondary | ICD-10-CM | POA: Diagnosis not present

## 2019-04-07 DIAGNOSIS — E119 Type 2 diabetes mellitus without complications: Secondary | ICD-10-CM | POA: Diagnosis not present

## 2019-04-23 DIAGNOSIS — Z6825 Body mass index (BMI) 25.0-25.9, adult: Secondary | ICD-10-CM | POA: Diagnosis not present

## 2019-04-23 DIAGNOSIS — Z0000 Encounter for general adult medical examination without abnormal findings: Secondary | ICD-10-CM | POA: Diagnosis not present

## 2019-04-23 DIAGNOSIS — Z1389 Encounter for screening for other disorder: Secondary | ICD-10-CM | POA: Diagnosis not present

## 2019-04-23 DIAGNOSIS — E663 Overweight: Secondary | ICD-10-CM | POA: Diagnosis not present

## 2019-05-18 DIAGNOSIS — I2510 Atherosclerotic heart disease of native coronary artery without angina pectoris: Secondary | ICD-10-CM | POA: Diagnosis not present

## 2019-05-18 DIAGNOSIS — I10 Essential (primary) hypertension: Secondary | ICD-10-CM | POA: Diagnosis not present

## 2019-05-18 DIAGNOSIS — E7849 Other hyperlipidemia: Secondary | ICD-10-CM | POA: Diagnosis not present

## 2019-05-18 DIAGNOSIS — E119 Type 2 diabetes mellitus without complications: Secondary | ICD-10-CM | POA: Diagnosis not present

## 2019-05-21 DIAGNOSIS — E663 Overweight: Secondary | ICD-10-CM | POA: Diagnosis not present

## 2019-05-21 DIAGNOSIS — G894 Chronic pain syndrome: Secondary | ICD-10-CM | POA: Diagnosis not present

## 2019-05-21 DIAGNOSIS — Z6825 Body mass index (BMI) 25.0-25.9, adult: Secondary | ICD-10-CM | POA: Diagnosis not present

## 2019-06-11 ENCOUNTER — Telehealth: Payer: Self-pay | Admitting: Neurology

## 2019-06-11 NOTE — Telephone Encounter (Signed)
Wife is calling in that patient has been going down hill rapidly. She was wanting to find out what she could do. She said she thinks he went into the 7th stage of dementia. She can not get him to eat or drink anything. Please call her back. Thanks!

## 2019-06-12 NOTE — Telephone Encounter (Signed)
Spoke with pts wife. States that pts dementia has went down Dustin [name] since last visit. He does not know where he he is, who she is or who their son is most of the time.  Pt does not have a fever, no signs of infection, no pain. Pt was dizzy yesterday after standing up and he did vomit. Wife states that pt has been eating good. BP and sugar controlled well with medications.  She wants to keep pt at home.

## 2019-06-12 NOTE — Telephone Encounter (Signed)
Would she be agreeable to getting Hospice involved if he is not eating or drinking anymore?

## 2019-06-12 NOTE — Telephone Encounter (Signed)
When I clarified about him eating she stated that he eats good. She has been giving him pedialyte, chicken soup and Ensure. So not sure that she would be agreeable to Hospice.  I am not sure why the first message said he was not eating and drinking???

## 2019-06-12 NOTE — Telephone Encounter (Signed)
(   see previous msg too)  Wife also stated she was needing orders so when they came out they can do blood work and urine so she wouldn't have to take him to the hospital for labs. He doesn't need any PT because he is not walking. Just FYI. Thanks!

## 2019-06-12 NOTE — Telephone Encounter (Signed)
Wife said she was returning your call. She was wanting a nurse to come out the hours and help. She is needing a referral to [facility]. They have used them before and she is comfortable with them. She said he wasn't eating for a couple of days but now he is eating and drinking good. She is needing some help at home. Thanks!

## 2019-06-15 NOTE — Telephone Encounter (Signed)
[place] for [facility] orders for home health. Thanks

## 2019-06-15 NOTE — Telephone Encounter (Signed)
Patient wife called and to check on the status  of the [facility] referral for the blood work please call

## 2019-06-15 NOTE — Telephone Encounter (Signed)
Home health form faxed to [facility] along with OV notes, demos and ins.cards

## 2019-06-17 DIAGNOSIS — Z743 Need for continuous supervision: Secondary | ICD-10-CM | POA: Diagnosis not present

## 2019-06-17 DIAGNOSIS — Z7984 Long term (current) use of oral hypoglycemic drugs: Secondary | ICD-10-CM | POA: Diagnosis not present

## 2019-06-17 DIAGNOSIS — E1122 Type 2 diabetes mellitus with diabetic chronic kidney disease: Secondary | ICD-10-CM | POA: Diagnosis not present

## 2019-06-17 DIAGNOSIS — N183 Chronic kidney disease, stage 3 (moderate): Secondary | ICD-10-CM | POA: Diagnosis not present

## 2019-06-17 DIAGNOSIS — F0391 Unspecified dementia with behavioral disturbance: Secondary | ICD-10-CM | POA: Diagnosis not present

## 2019-06-17 DIAGNOSIS — I129 Hypertensive chronic kidney disease with stage 1 through stage 4 chronic kidney disease, or unspecified chronic kidney disease: Secondary | ICD-10-CM | POA: Diagnosis not present

## 2019-06-17 DIAGNOSIS — I2510 Atherosclerotic heart disease of native coronary artery without angina pectoris: Secondary | ICD-10-CM | POA: Diagnosis not present

## 2019-06-18 DIAGNOSIS — G894 Chronic pain syndrome: Secondary | ICD-10-CM | POA: Diagnosis not present

## 2019-06-24 DIAGNOSIS — F0390 Unspecified dementia without behavioral disturbance: Secondary | ICD-10-CM | POA: Diagnosis not present

## 2019-06-24 DIAGNOSIS — Z681 Body mass index (BMI) 19 or less, adult: Secondary | ICD-10-CM | POA: Diagnosis not present

## 2019-06-24 DIAGNOSIS — G894 Chronic pain syndrome: Secondary | ICD-10-CM | POA: Diagnosis not present

## 2019-06-24 DIAGNOSIS — K2970 Gastritis, unspecified, without bleeding: Secondary | ICD-10-CM | POA: Diagnosis not present

## 2019-06-25 DIAGNOSIS — F0391 Unspecified dementia with behavioral disturbance: Secondary | ICD-10-CM | POA: Diagnosis not present

## 2019-06-25 DIAGNOSIS — I129 Hypertensive chronic kidney disease with stage 1 through stage 4 chronic kidney disease, or unspecified chronic kidney disease: Secondary | ICD-10-CM | POA: Diagnosis not present

## 2019-06-25 DIAGNOSIS — E1122 Type 2 diabetes mellitus with diabetic chronic kidney disease: Secondary | ICD-10-CM | POA: Diagnosis not present

## 2019-06-25 DIAGNOSIS — N183 Chronic kidney disease, stage 3 (moderate): Secondary | ICD-10-CM | POA: Diagnosis not present

## 2019-06-25 DIAGNOSIS — Z7984 Long term (current) use of oral hypoglycemic drugs: Secondary | ICD-10-CM | POA: Diagnosis not present

## 2019-06-25 DIAGNOSIS — I2510 Atherosclerotic heart disease of native coronary artery without angina pectoris: Secondary | ICD-10-CM | POA: Diagnosis not present

## 2019-07-01 DIAGNOSIS — N183 Chronic kidney disease, stage 3 (moderate): Secondary | ICD-10-CM | POA: Diagnosis not present

## 2019-07-01 DIAGNOSIS — I2510 Atherosclerotic heart disease of native coronary artery without angina pectoris: Secondary | ICD-10-CM | POA: Diagnosis not present

## 2019-07-01 DIAGNOSIS — F0391 Unspecified dementia with behavioral disturbance: Secondary | ICD-10-CM | POA: Diagnosis not present

## 2019-07-01 DIAGNOSIS — I129 Hypertensive chronic kidney disease with stage 1 through stage 4 chronic kidney disease, or unspecified chronic kidney disease: Secondary | ICD-10-CM | POA: Diagnosis not present

## 2019-07-01 DIAGNOSIS — Z7984 Long term (current) use of oral hypoglycemic drugs: Secondary | ICD-10-CM | POA: Diagnosis not present

## 2019-07-01 DIAGNOSIS — E1122 Type 2 diabetes mellitus with diabetic chronic kidney disease: Secondary | ICD-10-CM | POA: Diagnosis not present

## 2019-07-10 DIAGNOSIS — F0391 Unspecified dementia with behavioral disturbance: Secondary | ICD-10-CM | POA: Diagnosis not present

## 2019-07-10 DIAGNOSIS — Z7984 Long term (current) use of oral hypoglycemic drugs: Secondary | ICD-10-CM | POA: Diagnosis not present

## 2019-07-10 DIAGNOSIS — E1122 Type 2 diabetes mellitus with diabetic chronic kidney disease: Secondary | ICD-10-CM | POA: Diagnosis not present

## 2019-07-10 DIAGNOSIS — I129 Hypertensive chronic kidney disease with stage 1 through stage 4 chronic kidney disease, or unspecified chronic kidney disease: Secondary | ICD-10-CM | POA: Diagnosis not present

## 2019-07-10 DIAGNOSIS — N183 Chronic kidney disease, stage 3 (moderate): Secondary | ICD-10-CM | POA: Diagnosis not present

## 2019-07-10 DIAGNOSIS — I2510 Atherosclerotic heart disease of native coronary artery without angina pectoris: Secondary | ICD-10-CM | POA: Diagnosis not present

## 2019-07-22 ENCOUNTER — Telehealth: Payer: Self-pay | Admitting: Neurology

## 2019-07-22 DIAGNOSIS — M1991 Primary osteoarthritis, unspecified site: Secondary | ICD-10-CM | POA: Diagnosis not present

## 2019-07-22 DIAGNOSIS — I2510 Atherosclerotic heart disease of native coronary artery without angina pectoris: Secondary | ICD-10-CM | POA: Diagnosis not present

## 2019-07-22 DIAGNOSIS — I10 Essential (primary) hypertension: Secondary | ICD-10-CM | POA: Diagnosis not present

## 2019-07-22 DIAGNOSIS — E782 Mixed hyperlipidemia: Secondary | ICD-10-CM | POA: Diagnosis not present

## 2019-07-22 NOTE — Telephone Encounter (Signed)
Holly called from [facility] to update patients  Dustin [name]. [facility] [place]. He is needing his Lexapro 10 MG refilled at that location. Thank you

## 2019-07-23 ENCOUNTER — Other Ambulatory Visit: Payer: Self-pay

## 2019-07-23 DIAGNOSIS — F419 Anxiety disorder, unspecified: Secondary | ICD-10-CM

## 2019-07-23 DIAGNOSIS — E663 Overweight: Secondary | ICD-10-CM | POA: Diagnosis not present

## 2019-07-23 DIAGNOSIS — G894 Chronic pain syndrome: Secondary | ICD-10-CM | POA: Diagnosis not present

## 2019-07-23 DIAGNOSIS — Z6825 Body mass index (BMI) 25.0-25.9, adult: Secondary | ICD-10-CM | POA: Diagnosis not present

## 2019-07-23 MED ORDER — ESCITALOPRAM OXALATE 10 MG PO TABS: ORAL_TABLET | ORAL | 3 refills | Status: DC

## 2019-07-23 NOTE — Telephone Encounter (Signed)
Refills sent to [facility] 

## 2019-07-31 DIAGNOSIS — Z6824 Body mass index (BMI) 24.0-24.9, adult: Secondary | ICD-10-CM | POA: Diagnosis not present

## 2019-07-31 DIAGNOSIS — M1991 Primary osteoarthritis, unspecified site: Secondary | ICD-10-CM | POA: Diagnosis not present

## 2019-07-31 DIAGNOSIS — G4700 Insomnia, unspecified: Secondary | ICD-10-CM | POA: Diagnosis not present

## 2019-07-31 DIAGNOSIS — F0390 Unspecified dementia without behavioral disturbance: Secondary | ICD-10-CM | POA: Diagnosis not present

## 2019-08-22 DIAGNOSIS — E1129 Type 2 diabetes mellitus with other diabetic kidney complication: Secondary | ICD-10-CM | POA: Diagnosis not present

## 2019-08-22 DIAGNOSIS — I10 Essential (primary) hypertension: Secondary | ICD-10-CM | POA: Diagnosis not present

## 2019-08-22 DIAGNOSIS — M1991 Primary osteoarthritis, unspecified site: Secondary | ICD-10-CM | POA: Diagnosis not present

## 2019-08-22 DIAGNOSIS — F0390 Unspecified dementia without behavioral disturbance: Secondary | ICD-10-CM | POA: Diagnosis not present

## 2019-09-09 ENCOUNTER — Encounter: Payer: Self-pay | Admitting: Neurology

## 2019-09-09 ENCOUNTER — Ambulatory Visit (INDEPENDENT_AMBULATORY_CARE_PROVIDER_SITE_OTHER): Payer: Medicare Other | Admitting: Neurology

## 2019-09-09 ENCOUNTER — Other Ambulatory Visit: Payer: Self-pay

## 2019-09-09 VITALS — BP 116/81 | HR 72 | Ht 68.0 in | Wt 163.0 lb

## 2019-09-09 DIAGNOSIS — F03B18 Unspecified dementia, moderate, with other behavioral disturbance: Secondary | ICD-10-CM

## 2019-09-09 DIAGNOSIS — F0391 Unspecified dementia with behavioral disturbance: Secondary | ICD-10-CM | POA: Diagnosis not present

## 2019-09-09 DIAGNOSIS — F419 Anxiety disorder, unspecified: Secondary | ICD-10-CM

## 2019-09-09 DIAGNOSIS — G20 Parkinson's disease: Secondary | ICD-10-CM

## 2019-09-09 MED ORDER — ESCITALOPRAM OXALATE 10 MG PO TABS: ORAL_TABLET | ORAL | 3 refills | Status: DC

## 2019-09-09 MED ORDER — DIVALPROEX SODIUM 125 MG PO DR TAB: DELAYED_RELEASE_TABLET | ORAL | 11 refills | Status: AC

## 2019-09-09 NOTE — Patient Instructions (Signed)
1. Continue Lexapro (Escitalopram) 10mg  three times a day  2. Try the Depakote 125mg  1 tablet as needed for times he is a little more agitated  3. Continue 24/7 care  4. Follow-up in 6 months, call for any changes.
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
## 2019-09-11 ENCOUNTER — Other Ambulatory Visit: Payer: Self-pay

## 2019-09-11 DIAGNOSIS — Z7984 Long term (current) use of oral hypoglycemic drugs: Secondary | ICD-10-CM | POA: Diagnosis not present

## 2019-09-11 DIAGNOSIS — I672 Cerebral atherosclerosis: Secondary | ICD-10-CM | POA: Diagnosis not present

## 2019-09-11 DIAGNOSIS — F0281 Dementia in other diseases classified elsewhere with behavioral disturbance: Secondary | ICD-10-CM | POA: Diagnosis not present

## 2019-09-11 DIAGNOSIS — I110 Hypertensive heart disease with heart failure: Secondary | ICD-10-CM | POA: Diagnosis not present

## 2019-09-11 DIAGNOSIS — G20 Parkinson's disease: Secondary | ICD-10-CM | POA: Diagnosis not present

## 2019-09-11 DIAGNOSIS — F419 Anxiety disorder, unspecified: Secondary | ICD-10-CM | POA: Diagnosis not present

## 2019-09-11 DIAGNOSIS — I25119 Atherosclerotic heart disease of native coronary artery with unspecified angina pectoris: Secondary | ICD-10-CM | POA: Diagnosis not present

## 2019-09-11 DIAGNOSIS — I252 Old myocardial infarction: Secondary | ICD-10-CM | POA: Diagnosis not present

## 2019-09-11 DIAGNOSIS — E119 Type 2 diabetes mellitus without complications: Secondary | ICD-10-CM | POA: Diagnosis not present

## 2019-09-11 DIAGNOSIS — I5032 Chronic diastolic (congestive) heart failure: Secondary | ICD-10-CM | POA: Diagnosis not present

## 2019-09-21 DIAGNOSIS — G894 Chronic pain syndrome: Secondary | ICD-10-CM | POA: Diagnosis not present

## 2019-09-21 DIAGNOSIS — F0390 Unspecified dementia without behavioral disturbance: Secondary | ICD-10-CM | POA: Diagnosis not present

## 2019-09-21 DIAGNOSIS — M1991 Primary osteoarthritis, unspecified site: Secondary | ICD-10-CM | POA: Diagnosis not present

## 2019-09-21 DIAGNOSIS — E1140 Type 2 diabetes mellitus with diabetic neuropathy, unspecified: Secondary | ICD-10-CM | POA: Diagnosis not present

## 2019-09-21 DIAGNOSIS — I10 Essential (primary) hypertension: Secondary | ICD-10-CM | POA: Diagnosis not present

## 2019-09-21 DIAGNOSIS — Z6824 Body mass index (BMI) 24.0-24.9, adult: Secondary | ICD-10-CM | POA: Diagnosis not present

## 2019-09-22 NOTE — Progress Notes (Signed)
Request from [facility] for medical records to process patients need for wheelchair.  Information faxed to Nori Riis [phone] fax [phone].

## 2019-09-24 ENCOUNTER — Telehealth: Payer: Self-pay | Admitting: Neurology

## 2019-09-24 NOTE — Telephone Encounter (Signed)
Patient is not doing good on the depakote  125 mg. She states that he is doing worst than before. Please call she needs to know what she needs to do

## 2019-09-25 MED ORDER — TRAZODONE HCL 50 MG PO TABS: ORAL_TABLET | ORAL | 5 refills | Status: AC

## 2019-09-25 NOTE — Telephone Encounter (Signed)
Spoke with pt's wife and informed her of the message from doctor. She understood and had no additional questions at this time.

## 2019-09-25 NOTE — Telephone Encounter (Signed)
Pls let her know I sent in a prescription for Trazodone 50mg : take 1/2 tablet every night for 1 week, then increase to 1 tablet every night. Main side effect can be drowsiness. Call for any issues. Thanks

## 2019-09-25 NOTE — Telephone Encounter (Signed)
Dr. Delice Lesch, I spoke with pt's wife and she would like him to start another medication. She request this be sent [facility] (in chart) with note for pharmacy to deliver medication this afternoon as they dont deliver over the weekend.

## 2019-09-25 NOTE — Telephone Encounter (Signed)
He has been sensitive to medications, pls have wife stop Depakote if she has not yet done that, and pls ask if she would like to try another medication to see if it helps better. Thanks

## 2019-09-25 NOTE — Addendum Note (Signed)
Addended by: Cameron Sprang on: 09/25/2019 01:08 PM   Modules accepted: Orders

## 2019-09-28 ENCOUNTER — Telehealth: Payer: Self-pay | Admitting: Neurology

## 2019-09-28 NOTE — Telephone Encounter (Signed)
Wife left msg with after hours about husband dr switching medication and it was called into pharm to be delivered to their home. It hasn't been delivered. Would like to know what to do for the weekend. Thanks!

## 2019-09-28 NOTE — Telephone Encounter (Signed)
Left message to follow up on medication and message.

## 2019-10-01 DIAGNOSIS — G894 Chronic pain syndrome: Secondary | ICD-10-CM | POA: Diagnosis not present

## 2019-10-01 DIAGNOSIS — Z6824 Body mass index (BMI) 24.0-24.9, adult: Secondary | ICD-10-CM | POA: Diagnosis not present

## 2019-10-01 DIAGNOSIS — E119 Type 2 diabetes mellitus without complications: Secondary | ICD-10-CM | POA: Diagnosis not present

## 2019-10-01 DIAGNOSIS — I10 Essential (primary) hypertension: Secondary | ICD-10-CM | POA: Diagnosis not present

## 2019-10-02 ENCOUNTER — Telehealth: Payer: Self-pay
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
## 2019-10-06 MED ORDER — LORAZEPAM 1 MG PO TABS: ORAL_TABLET | ORAL | 3 refills | Status: AC

## 2019-10-06 NOTE — Addendum Note (Signed)
Addended by: Cameron Sprang on: 10/06/2019 04:59 PM   Modules accepted: Orders

## 2019-10-06 NOTE — Telephone Encounter (Signed)
Spoke to wife, several medications tried in the past caused more agitation. Asking about Xanax or Ativan. I discussed that benzodiazepines are typically avoided in dementia patients, he has been very sensitive to various medications. We can try prn Ativan 1mg  for agitation, side effects discussed. 10 tablets will be sent in, wife will know if more helpful than prior meds.

## 2019-10-14 ENCOUNTER — Telehealth: Payer: Self-pay

## 2019-10-14 NOTE — Telephone Encounter (Signed)
Dustin [name] needs a note stating why pt requires a light weight wheelchair. The note is needed for insurance to cover it. Office notes were not sufficient.  Fax note to [facility] at [phone]

## 2019-10-19 ENCOUNTER — Telehealth: Payer: Self-pay | Admitting: Neurology

## 2019-10-19 NOTE — Telephone Encounter (Signed)
Patient's social worker called requesting a call back from a nurse or Dr. Delice Lesch to discuss a possible recommendation to hospice.

## 2019-10-20 ENCOUNTER — Other Ambulatory Visit: Payer: Self-pay

## 2019-10-20 DIAGNOSIS — G20 Parkinson's disease: Secondary | ICD-10-CM

## 2019-10-20 DIAGNOSIS — F0391 Unspecified dementia with behavioral disturbance: Secondary | ICD-10-CM

## 2019-10-20 DIAGNOSIS — F03B18 Unspecified dementia, moderate, with other behavioral disturbance: Secondary | ICD-10-CM

## 2019-10-20 NOTE — Telephone Encounter (Signed)
Agree with hospice referral, thanks

## 2019-10-20 NOTE — Telephone Encounter (Signed)
Not walking, struggle to get him to eat and drink.  Sleeping all of the time.  Wife asked for social worker Millie to inform Dr. Delice Lesch of this.  She would like for Hospice to be contacted.

## 2019-10-20 NOTE — Telephone Encounter (Signed)
Referral sent to [facility]. Fax [phone]

## 2019-10-21 ENCOUNTER — Telehealth: Payer: Self-pay

## 2019-10-21 ENCOUNTER — Ambulatory Visit: Payer: PRIVATE HEALTH INSURANCE | Admitting: Neurology

## 2019-10-21 NOTE — Telephone Encounter (Signed)
Ron from [facility] called questioning if Dr. Delice Lesch is going to be the prescribing Dr for patient or if the patient would be using one of the physicians within hospice.  Dr. Delice Lesch what would you like to do?

## 2019-10-21 NOTE — Telephone Encounter (Signed)
Dr. Delice Lesch will not be the attending of record, please request that the physicians for [facility] do this.

## 2019-10-21 NOTE — Telephone Encounter (Signed)
Ron informed that a physician with hospice will need to manage pt's care.

## 2019-10-22 ENCOUNTER — Telehealth: Payer: Self-pay | Admitting: Neurology

## 2019-10-22 DIAGNOSIS — F0390 Unspecified dementia without behavioral disturbance: Secondary | ICD-10-CM | POA: Diagnosis not present

## 2019-10-22 DIAGNOSIS — I10 Essential (primary) hypertension: Secondary | ICD-10-CM | POA: Diagnosis not present

## 2019-10-22 DIAGNOSIS — E1140 Type 2 diabetes mellitus with diabetic neuropathy, unspecified: Secondary | ICD-10-CM | POA: Diagnosis not present

## 2019-10-22 DIAGNOSIS — E46 Unspecified protein-calorie malnutrition: Secondary | ICD-10-CM | POA: Diagnosis not present

## 2019-10-22 DIAGNOSIS — G894 Chronic pain syndrome: Secondary | ICD-10-CM | POA: Diagnosis not present

## 2019-10-22 DIAGNOSIS — G311 Senile degeneration of brain, not elsewhere classified: Secondary | ICD-10-CM | POA: Diagnosis not present

## 2019-10-22 DIAGNOSIS — R531 Weakness: Secondary | ICD-10-CM | POA: Diagnosis not present

## 2019-10-22 DIAGNOSIS — E119 Type 2 diabetes mellitus without complications: Secondary | ICD-10-CM | POA: Diagnosis not present

## 2019-10-22 NOTE — Telephone Encounter (Signed)
Spoke with wife. Someone from Hospice called her this morning trying to explain why they were coming out and it did upset her. They were talking about end of life and may be the pt having less than 27m to live. Wife wants to know why they are coming and if Dr. Delice Lesch felt like pt had less than 56m to live.  Explained to wife that Hospice would be coming out today for a consultation and to discuss a treatment plan. Dr. Delice Lesch did not say that patient had less than 77m to live. Informed wife that any changes in medications or treatment regimens will be up to the Hospice physician if she decides to move forward with them. Wife understood. She admits that he has went down since he was last seen in the office and that she could barely get him to eat. Pt has not walked in one week and she has been bathing him forever.  Wife is mainly concerned about the medications that Hospice will be giving the patient. Instructed her to have the consultation. That Hospice is there to help her and the patient. She does not have to agree to the plan if it is not what she wants for the patient. Wife understood.

## 2019-10-22 NOTE — Telephone Encounter (Signed)
Patient wife is upset and confused about hospice coming in she would like to talk to someone today before they come to see him at 10 please call

## 2019-10-23 DIAGNOSIS — G311 Senile degeneration of brain, not elsewhere classified: Secondary | ICD-10-CM | POA: Diagnosis not present

## 2019-10-23 DIAGNOSIS — I10 Essential (primary) hypertension: Secondary | ICD-10-CM | POA: Diagnosis not present

## 2019-10-23 DIAGNOSIS — G894 Chronic pain syndrome: Secondary | ICD-10-CM | POA: Diagnosis not present

## 2019-10-23 DIAGNOSIS — E119 Type 2 diabetes mellitus without complications: Secondary | ICD-10-CM | POA: Diagnosis not present

## 2019-10-23 DIAGNOSIS — E46 Unspecified protein-calorie malnutrition: Secondary | ICD-10-CM | POA: Diagnosis not present

## 2019-10-23 DIAGNOSIS — R531 Weakness: Secondary | ICD-10-CM | POA: Diagnosis not present

## 2019-10-26 DIAGNOSIS — E46 Unspecified protein-calorie malnutrition: Secondary | ICD-10-CM | POA: Diagnosis not present

## 2019-10-26 DIAGNOSIS — R531 Weakness: Secondary | ICD-10-CM | POA: Diagnosis not present

## 2019-10-26 DIAGNOSIS — G311 Senile degeneration of brain, not elsewhere classified: Secondary | ICD-10-CM | POA: Diagnosis not present

## 2019-10-26 DIAGNOSIS — I10 Essential (primary) hypertension: Secondary | ICD-10-CM | POA: Diagnosis not present

## 2019-10-26 DIAGNOSIS — E119 Type 2 diabetes mellitus without complications: Secondary | ICD-10-CM | POA: Diagnosis not present

## 2019-10-26 DIAGNOSIS — G894 Chronic pain syndrome: Secondary | ICD-10-CM | POA: Diagnosis not present

## 2019-10-28 DIAGNOSIS — E119 Type 2 diabetes mellitus without complications: Secondary | ICD-10-CM | POA: Diagnosis not present

## 2019-10-28 DIAGNOSIS — G894 Chronic pain syndrome: Secondary | ICD-10-CM | POA: Diagnosis not present

## 2019-10-28 DIAGNOSIS — E46 Unspecified protein-calorie malnutrition: Secondary | ICD-10-CM | POA: Diagnosis not present

## 2019-10-28 DIAGNOSIS — G311 Senile degeneration of brain, not elsewhere classified: Secondary | ICD-10-CM | POA: Diagnosis not present

## 2019-10-28 DIAGNOSIS — R531 Weakness: Secondary | ICD-10-CM | POA: Diagnosis not present

## 2019-10-28 DIAGNOSIS — I10 Essential (primary) hypertension: Secondary | ICD-10-CM | POA: Diagnosis not present

## 2019-10-29 DIAGNOSIS — R531 Weakness: Secondary | ICD-10-CM | POA: Diagnosis not present

## 2019-10-29 DIAGNOSIS — E46 Unspecified protein-calorie malnutrition: Secondary | ICD-10-CM | POA: Diagnosis not present

## 2019-10-29 DIAGNOSIS — G894 Chronic pain syndrome: Secondary | ICD-10-CM | POA: Diagnosis not present

## 2019-10-29 DIAGNOSIS — I10 Essential (primary) hypertension: Secondary | ICD-10-CM | POA: Diagnosis not present

## 2019-10-29 DIAGNOSIS — G311 Senile degeneration of brain, not elsewhere classified: Secondary | ICD-10-CM | POA: Diagnosis not present

## 2019-10-29 DIAGNOSIS — E119 Type 2 diabetes mellitus without complications: Secondary | ICD-10-CM | POA: Diagnosis not present

## 2019-10-30 DIAGNOSIS — R531 Weakness: Secondary | ICD-10-CM | POA: Diagnosis not present

## 2019-10-30 DIAGNOSIS — E46 Unspecified protein-calorie malnutrition: Secondary | ICD-10-CM | POA: Diagnosis not present

## 2019-10-30 DIAGNOSIS — G311 Senile degeneration of brain, not elsewhere classified: Secondary | ICD-10-CM | POA: Diagnosis not present

## 2019-10-30 DIAGNOSIS — I10 Essential (primary) hypertension: Secondary | ICD-10-CM | POA: Diagnosis not present

## 2019-10-30 DIAGNOSIS — G894 Chronic pain syndrome: Secondary | ICD-10-CM | POA: Diagnosis not present

## 2019-10-30 DIAGNOSIS — E119 Type 2 diabetes mellitus without complications: Secondary | ICD-10-CM | POA: Diagnosis not present

## 2019-11-02 DIAGNOSIS — G894 Chronic pain syndrome: Secondary | ICD-10-CM | POA: Diagnosis not present

## 2019-11-02 DIAGNOSIS — G311 Senile degeneration of brain, not elsewhere classified: Secondary | ICD-10-CM | POA: Diagnosis not present

## 2019-11-02 DIAGNOSIS — E46 Unspecified protein-calorie malnutrition: Secondary | ICD-10-CM | POA: Diagnosis not present

## 2019-11-02 DIAGNOSIS — I10 Essential (primary) hypertension: Secondary | ICD-10-CM | POA: Diagnosis not present

## 2019-11-02 DIAGNOSIS — R531 Weakness: Secondary | ICD-10-CM | POA: Diagnosis not present

## 2019-11-02 DIAGNOSIS — E119 Type 2 diabetes mellitus without complications: Secondary | ICD-10-CM | POA: Diagnosis not present

## 2019-11-04 DIAGNOSIS — G311 Senile degeneration of brain, not elsewhere classified: Secondary | ICD-10-CM | POA: Diagnosis not present

## 2019-11-04 DIAGNOSIS — I10 Essential (primary) hypertension: Secondary | ICD-10-CM | POA: Diagnosis not present

## 2019-11-04 DIAGNOSIS — E119 Type 2 diabetes mellitus without complications: Secondary | ICD-10-CM | POA: Diagnosis not present

## 2019-11-04 DIAGNOSIS — E46 Unspecified protein-calorie malnutrition: Secondary | ICD-10-CM | POA: Diagnosis not present

## 2019-11-04 DIAGNOSIS — R531 Weakness: Secondary | ICD-10-CM | POA: Diagnosis not present

## 2019-11-04 DIAGNOSIS — G894 Chronic pain syndrome: Secondary | ICD-10-CM | POA: Diagnosis not present

## 2019-11-06 DIAGNOSIS — G894 Chronic pain syndrome: Secondary | ICD-10-CM | POA: Diagnosis not present

## 2019-11-06 DIAGNOSIS — E46 Unspecified protein-calorie malnutrition: Secondary | ICD-10-CM | POA: Diagnosis not present

## 2019-11-06 DIAGNOSIS — R531 Weakness: Secondary | ICD-10-CM | POA: Diagnosis not present

## 2019-11-06 DIAGNOSIS — G311 Senile degeneration of brain, not elsewhere classified: Secondary | ICD-10-CM | POA: Diagnosis not present

## 2019-11-06 DIAGNOSIS — E119 Type 2 diabetes mellitus without complications: Secondary | ICD-10-CM | POA: Diagnosis not present

## 2019-11-06 DIAGNOSIS — I10 Essential (primary) hypertension: Secondary | ICD-10-CM | POA: Diagnosis not present

## 2019-11-09 DIAGNOSIS — G311 Senile degeneration of brain, not elsewhere classified: Secondary | ICD-10-CM | POA: Diagnosis not present

## 2019-11-09 DIAGNOSIS — E119 Type 2 diabetes mellitus without complications: Secondary | ICD-10-CM | POA: Diagnosis not present

## 2019-11-09 DIAGNOSIS — E46 Unspecified protein-calorie malnutrition: Secondary | ICD-10-CM | POA: Diagnosis not present

## 2019-11-09 DIAGNOSIS — G894 Chronic pain syndrome: Secondary | ICD-10-CM | POA: Diagnosis not present

## 2019-11-09 DIAGNOSIS — I10 Essential (primary) hypertension: Secondary | ICD-10-CM | POA: Diagnosis not present

## 2019-11-09 DIAGNOSIS — R531 Weakness: Secondary | ICD-10-CM | POA: Diagnosis not present

## 2019-11-11 DIAGNOSIS — G894 Chronic pain syndrome: Secondary | ICD-10-CM | POA: Diagnosis not present

## 2019-11-11 DIAGNOSIS — E119 Type 2 diabetes mellitus without complications: Secondary | ICD-10-CM | POA: Diagnosis not present

## 2019-11-11 DIAGNOSIS — R531 Weakness: Secondary | ICD-10-CM | POA: Diagnosis not present

## 2019-11-11 DIAGNOSIS — G311 Senile degeneration of brain, not elsewhere classified: Secondary | ICD-10-CM | POA: Diagnosis not present

## 2019-11-11 DIAGNOSIS — E46 Unspecified protein-calorie malnutrition: Secondary | ICD-10-CM | POA: Diagnosis not present

## 2019-11-11 DIAGNOSIS — I10 Essential (primary) hypertension: Secondary | ICD-10-CM | POA: Diagnosis not present

## 2019-11-13 DIAGNOSIS — E119 Type 2 diabetes mellitus without complications: Secondary | ICD-10-CM | POA: Diagnosis not present

## 2019-11-13 DIAGNOSIS — G311 Senile degeneration of brain, not elsewhere classified: Secondary | ICD-10-CM | POA: Diagnosis not present

## 2019-11-13 DIAGNOSIS — E46 Unspecified protein-calorie malnutrition: Secondary | ICD-10-CM | POA: Diagnosis not present

## 2019-11-13 DIAGNOSIS — G894 Chronic pain syndrome: Secondary | ICD-10-CM | POA: Diagnosis not present

## 2019-11-13 DIAGNOSIS — I10 Essential (primary) hypertension: Secondary | ICD-10-CM | POA: Diagnosis not present

## 2019-11-13 DIAGNOSIS — R531 Weakness: Secondary | ICD-10-CM | POA: Diagnosis not present

## 2019-11-16 DIAGNOSIS — G894 Chronic pain syndrome: Secondary | ICD-10-CM | POA: Diagnosis not present

## 2019-11-16 DIAGNOSIS — G311 Senile degeneration of brain, not elsewhere classified: Secondary | ICD-10-CM | POA: Diagnosis not present

## 2019-11-16 DIAGNOSIS — I10 Essential (primary) hypertension: Secondary | ICD-10-CM | POA: Diagnosis not present

## 2019-11-16 DIAGNOSIS — R531 Weakness: Secondary | ICD-10-CM | POA: Diagnosis not present

## 2019-11-16 DIAGNOSIS — E46 Unspecified protein-calorie malnutrition: Secondary | ICD-10-CM | POA: Diagnosis not present

## 2019-11-16 DIAGNOSIS — E119 Type 2 diabetes mellitus without complications: Secondary | ICD-10-CM | POA: Diagnosis not present

## 2019-11-17 DIAGNOSIS — G311 Senile degeneration of brain, not elsewhere classified: Secondary | ICD-10-CM | POA: Diagnosis not present

## 2019-11-17 DIAGNOSIS — G894 Chronic pain syndrome: Secondary | ICD-10-CM | POA: Diagnosis not present

## 2019-11-17 DIAGNOSIS — E119 Type 2 diabetes mellitus without complications: Secondary | ICD-10-CM | POA: Diagnosis not present

## 2019-11-17 DIAGNOSIS — I10 Essential (primary) hypertension: Secondary | ICD-10-CM | POA: Diagnosis not present

## 2019-11-17 DIAGNOSIS — R531 Weakness: Secondary | ICD-10-CM | POA: Diagnosis not present

## 2019-11-17 DIAGNOSIS — E46 Unspecified protein-calorie malnutrition: Secondary | ICD-10-CM | POA: Diagnosis not present

## 2019-11-18 DIAGNOSIS — G894 Chronic pain syndrome: Secondary | ICD-10-CM | POA: Diagnosis not present

## 2019-11-18 DIAGNOSIS — I10 Essential (primary) hypertension: Secondary | ICD-10-CM | POA: Diagnosis not present

## 2019-11-18 DIAGNOSIS — R531 Weakness: Secondary | ICD-10-CM | POA: Diagnosis not present

## 2019-11-18 DIAGNOSIS — E46 Unspecified protein-calorie malnutrition: Secondary | ICD-10-CM | POA: Diagnosis not present

## 2019-11-18 DIAGNOSIS — E119 Type 2 diabetes mellitus without complications: Secondary | ICD-10-CM | POA: Diagnosis not present

## 2019-11-18 DIAGNOSIS — G311 Senile degeneration of brain, not elsewhere classified: Secondary | ICD-10-CM | POA: Diagnosis not present

## 2019-11-20 DIAGNOSIS — G311 Senile degeneration of brain, not elsewhere classified: Secondary | ICD-10-CM | POA: Diagnosis not present

## 2019-11-20 DIAGNOSIS — E119 Type 2 diabetes mellitus without complications: Secondary | ICD-10-CM | POA: Diagnosis not present

## 2019-11-20 DIAGNOSIS — R531 Weakness: Secondary | ICD-10-CM | POA: Diagnosis not present

## 2019-11-20 DIAGNOSIS — G894 Chronic pain syndrome: Secondary | ICD-10-CM | POA: Diagnosis not present

## 2019-11-20 DIAGNOSIS — I10 Essential (primary) hypertension: Secondary | ICD-10-CM | POA: Diagnosis not present

## 2019-11-20 DIAGNOSIS — E46 Unspecified protein-calorie malnutrition: Secondary | ICD-10-CM | POA: Diagnosis not present

## 2019-11-22 DIAGNOSIS — F0390 Unspecified dementia without behavioral disturbance: Secondary | ICD-10-CM | POA: Diagnosis not present

## 2019-11-22 DIAGNOSIS — E1140 Type 2 diabetes mellitus with diabetic neuropathy, unspecified: Secondary | ICD-10-CM | POA: Diagnosis not present

## 2019-11-22 DIAGNOSIS — I10 Essential (primary) hypertension: Secondary | ICD-10-CM | POA: Diagnosis not present

## 2019-11-23 DIAGNOSIS — R531 Weakness: Secondary | ICD-10-CM | POA: Diagnosis not present

## 2019-11-23 DIAGNOSIS — G311 Senile degeneration of brain, not elsewhere classified: Secondary | ICD-10-CM | POA: Diagnosis not present

## 2019-11-23 DIAGNOSIS — E46 Unspecified protein-calorie malnutrition: Secondary | ICD-10-CM | POA: Diagnosis not present

## 2019-11-23 DIAGNOSIS — E119 Type 2 diabetes mellitus without complications: Secondary | ICD-10-CM | POA: Diagnosis not present

## 2019-11-23 DIAGNOSIS — G894 Chronic pain syndrome: Secondary | ICD-10-CM | POA: Diagnosis not present

## 2019-11-23 DIAGNOSIS — I10 Essential (primary) hypertension: Secondary | ICD-10-CM | POA: Diagnosis not present

## 2019-11-25 DIAGNOSIS — G311 Senile degeneration of brain, not elsewhere classified: Secondary | ICD-10-CM | POA: Diagnosis not present

## 2019-11-25 DIAGNOSIS — G894 Chronic pain syndrome: Secondary | ICD-10-CM | POA: Diagnosis not present

## 2019-11-25 DIAGNOSIS — I10 Essential (primary) hypertension: Secondary | ICD-10-CM | POA: Diagnosis not present

## 2019-11-25 DIAGNOSIS — E46 Unspecified protein-calorie malnutrition: Secondary | ICD-10-CM | POA: Diagnosis not present

## 2019-11-25 DIAGNOSIS — E119 Type 2 diabetes mellitus without complications: Secondary | ICD-10-CM | POA: Diagnosis not present

## 2019-11-25 DIAGNOSIS — R531 Weakness: Secondary | ICD-10-CM | POA: Diagnosis not present

## 2019-11-27 DIAGNOSIS — R531 Weakness: Secondary | ICD-10-CM | POA: Diagnosis not present

## 2019-11-27 DIAGNOSIS — G311 Senile degeneration of brain, not elsewhere classified: Secondary | ICD-10-CM | POA: Diagnosis not present

## 2019-11-27 DIAGNOSIS — E119 Type 2 diabetes mellitus without complications: Secondary | ICD-10-CM | POA: Diagnosis not present

## 2019-11-27 DIAGNOSIS — I10 Essential (primary) hypertension: Secondary | ICD-10-CM | POA: Diagnosis not present

## 2019-11-27 DIAGNOSIS — E46 Unspecified protein-calorie malnutrition: Secondary | ICD-10-CM | POA: Diagnosis not present

## 2019-11-27 DIAGNOSIS — G894 Chronic pain syndrome: Secondary | ICD-10-CM | POA: Diagnosis not present

## 2019-11-30 DIAGNOSIS — G311 Senile degeneration of brain, not elsewhere classified: Secondary | ICD-10-CM | POA: Diagnosis not present

## 2019-11-30 DIAGNOSIS — E46 Unspecified protein-calorie malnutrition: Secondary | ICD-10-CM | POA: Diagnosis not present

## 2019-11-30 DIAGNOSIS — I10 Essential (primary) hypertension: Secondary | ICD-10-CM | POA: Diagnosis not present

## 2019-11-30 DIAGNOSIS — G894 Chronic pain syndrome: Secondary | ICD-10-CM | POA: Diagnosis not present

## 2019-11-30 DIAGNOSIS — E119 Type 2 diabetes mellitus without complications: Secondary | ICD-10-CM | POA: Diagnosis not present

## 2019-11-30 DIAGNOSIS — R531 Weakness: Secondary | ICD-10-CM | POA: Diagnosis not present

## 2019-12-01 DIAGNOSIS — R531 Weakness: Secondary | ICD-10-CM | POA: Diagnosis not present

## 2019-12-01 DIAGNOSIS — I10 Essential (primary) hypertension: Secondary | ICD-10-CM | POA: Diagnosis not present

## 2019-12-01 DIAGNOSIS — E46 Unspecified protein-calorie malnutrition: Secondary | ICD-10-CM | POA: Diagnosis not present

## 2019-12-01 DIAGNOSIS — G311 Senile degeneration of brain, not elsewhere classified: Secondary | ICD-10-CM | POA: Diagnosis not present

## 2019-12-01 DIAGNOSIS — E119 Type 2 diabetes mellitus without complications: Secondary | ICD-10-CM | POA: Diagnosis not present

## 2019-12-01 DIAGNOSIS — G894 Chronic pain syndrome: Secondary | ICD-10-CM | POA: Diagnosis not present

## 2019-12-02 DIAGNOSIS — R531 Weakness: Secondary | ICD-10-CM | POA: Diagnosis not present

## 2019-12-02 DIAGNOSIS — E46 Unspecified protein-calorie malnutrition: Secondary | ICD-10-CM | POA: Diagnosis not present

## 2019-12-02 DIAGNOSIS — G311 Senile degeneration of brain, not elsewhere classified: Secondary | ICD-10-CM | POA: Diagnosis not present

## 2019-12-02 DIAGNOSIS — I10 Essential (primary) hypertension: Secondary | ICD-10-CM | POA: Diagnosis not present

## 2019-12-02 DIAGNOSIS — G894 Chronic pain syndrome: Secondary | ICD-10-CM | POA: Diagnosis not present

## 2019-12-02 DIAGNOSIS — E119 Type 2 diabetes mellitus without complications: Secondary | ICD-10-CM | POA: Diagnosis not present

## 2019-12-04 DIAGNOSIS — G894 Chronic pain syndrome: Secondary | ICD-10-CM | POA: Diagnosis not present

## 2019-12-04 DIAGNOSIS — G311 Senile degeneration of brain, not elsewhere classified: Secondary | ICD-10-CM | POA: Diagnosis not present

## 2019-12-04 DIAGNOSIS — I10 Essential (primary) hypertension: Secondary | ICD-10-CM | POA: Diagnosis not present

## 2019-12-04 DIAGNOSIS — E46 Unspecified protein-calorie malnutrition: Secondary | ICD-10-CM | POA: Diagnosis not present

## 2019-12-04 DIAGNOSIS — E119 Type 2 diabetes mellitus without complications: Secondary | ICD-10-CM | POA: Diagnosis not present

## 2019-12-04 DIAGNOSIS — R531 Weakness: Secondary | ICD-10-CM | POA: Diagnosis not present

## 2019-12-07 DIAGNOSIS — G311 Senile degeneration of brain, not elsewhere classified: Secondary | ICD-10-CM | POA: Diagnosis not present

## 2019-12-07 DIAGNOSIS — E46 Unspecified protein-calorie malnutrition: Secondary | ICD-10-CM | POA: Diagnosis not present

## 2019-12-07 DIAGNOSIS — E119 Type 2 diabetes mellitus without complications: Secondary | ICD-10-CM | POA: Diagnosis not present

## 2019-12-07 DIAGNOSIS — R531 Weakness: Secondary | ICD-10-CM | POA: Diagnosis not present

## 2019-12-07 DIAGNOSIS — I10 Essential (primary) hypertension: Secondary | ICD-10-CM | POA: Diagnosis not present

## 2019-12-07 DIAGNOSIS — G894 Chronic pain syndrome: Secondary | ICD-10-CM | POA: Diagnosis not present

## 2019-12-09 DIAGNOSIS — G894 Chronic pain syndrome: Secondary | ICD-10-CM | POA: Diagnosis not present

## 2019-12-09 DIAGNOSIS — E46 Unspecified protein-calorie malnutrition: Secondary | ICD-10-CM | POA: Diagnosis not present

## 2019-12-09 DIAGNOSIS — G311 Senile degeneration of brain, not elsewhere classified: Secondary | ICD-10-CM | POA: Diagnosis not present

## 2019-12-09 DIAGNOSIS — R531 Weakness: Secondary | ICD-10-CM | POA: Diagnosis not present

## 2019-12-09 DIAGNOSIS — I10 Essential (primary) hypertension: Secondary | ICD-10-CM | POA: Diagnosis not present

## 2019-12-09 DIAGNOSIS — E119 Type 2 diabetes mellitus without complications: Secondary | ICD-10-CM | POA: Diagnosis not present

## 2019-12-11 DIAGNOSIS — I10 Essential (primary) hypertension: Secondary | ICD-10-CM | POA: Diagnosis not present

## 2019-12-11 DIAGNOSIS — G311 Senile degeneration of brain, not elsewhere classified: Secondary | ICD-10-CM | POA: Diagnosis not present

## 2019-12-11 DIAGNOSIS — R531 Weakness: Secondary | ICD-10-CM | POA: Diagnosis not present

## 2019-12-11 DIAGNOSIS — G894 Chronic pain syndrome: Secondary | ICD-10-CM | POA: Diagnosis not present

## 2019-12-11 DIAGNOSIS — E119 Type 2 diabetes mellitus without complications: Secondary | ICD-10-CM | POA: Diagnosis not present

## 2019-12-11 DIAGNOSIS — E46 Unspecified protein-calorie malnutrition: Secondary | ICD-10-CM | POA: Diagnosis not present

## 2019-12-14 DIAGNOSIS — G311 Senile degeneration of brain, not elsewhere classified: Secondary | ICD-10-CM | POA: Diagnosis not present

## 2019-12-14 DIAGNOSIS — R531 Weakness: Secondary | ICD-10-CM | POA: Diagnosis not present

## 2019-12-14 DIAGNOSIS — I10 Essential (primary) hypertension: Secondary | ICD-10-CM | POA: Diagnosis not present

## 2019-12-14 DIAGNOSIS — G894 Chronic pain syndrome: Secondary | ICD-10-CM | POA: Diagnosis not present

## 2019-12-14 DIAGNOSIS — E119 Type 2 diabetes mellitus without complications: Secondary | ICD-10-CM | POA: Diagnosis not present

## 2019-12-14 DIAGNOSIS — E46 Unspecified protein-calorie malnutrition: Secondary | ICD-10-CM | POA: Diagnosis not present

## 2019-12-16 DIAGNOSIS — I10 Essential (primary) hypertension: Secondary | ICD-10-CM | POA: Diagnosis not present

## 2019-12-16 DIAGNOSIS — G311 Senile degeneration of brain, not elsewhere classified: Secondary | ICD-10-CM | POA: Diagnosis not present

## 2019-12-16 DIAGNOSIS — E119 Type 2 diabetes mellitus without complications: Secondary | ICD-10-CM | POA: Diagnosis not present

## 2019-12-16 DIAGNOSIS — R531 Weakness: Secondary | ICD-10-CM | POA: Diagnosis not present

## 2019-12-16 DIAGNOSIS — E46 Unspecified protein-calorie malnutrition: Secondary | ICD-10-CM | POA: Diagnosis not present

## 2019-12-16 DIAGNOSIS — G894 Chronic pain syndrome: Secondary | ICD-10-CM | POA: Diagnosis not present

## 2019-12-18 DIAGNOSIS — G894 Chronic pain syndrome: Secondary | ICD-10-CM | POA: Diagnosis not present

## 2019-12-18 DIAGNOSIS — R531 Weakness: Secondary | ICD-10-CM | POA: Diagnosis not present

## 2019-12-18 DIAGNOSIS — I10 Essential (primary) hypertension: Secondary | ICD-10-CM | POA: Diagnosis not present

## 2019-12-18 DIAGNOSIS — E46 Unspecified protein-calorie malnutrition: Secondary | ICD-10-CM | POA: Diagnosis not present

## 2019-12-18 DIAGNOSIS — E119 Type 2 diabetes mellitus without complications: Secondary | ICD-10-CM | POA: Diagnosis not present

## 2019-12-18 DIAGNOSIS — G311 Senile degeneration of brain, not elsewhere classified: Secondary | ICD-10-CM | POA: Diagnosis not present

## 2019-12-21 DIAGNOSIS — R531 Weakness: Secondary | ICD-10-CM | POA: Diagnosis not present

## 2019-12-21 DIAGNOSIS — E119 Type 2 diabetes mellitus without complications: Secondary | ICD-10-CM | POA: Diagnosis not present

## 2019-12-21 DIAGNOSIS — E46 Unspecified protein-calorie malnutrition: Secondary | ICD-10-CM | POA: Diagnosis not present

## 2019-12-21 DIAGNOSIS — I10 Essential (primary) hypertension: Secondary | ICD-10-CM | POA: Diagnosis not present

## 2019-12-21 DIAGNOSIS — G311 Senile degeneration of brain, not elsewhere classified: Secondary | ICD-10-CM | POA: Diagnosis not present

## 2019-12-21 DIAGNOSIS — G894 Chronic pain syndrome: Secondary | ICD-10-CM | POA: Diagnosis not present

## 2019-12-23 DIAGNOSIS — E119 Type 2 diabetes mellitus without complications: Secondary | ICD-10-CM | POA: Diagnosis not present

## 2019-12-23 DIAGNOSIS — I10 Essential (primary) hypertension: Secondary | ICD-10-CM | POA: Diagnosis not present

## 2019-12-23 DIAGNOSIS — R531 Weakness: Secondary | ICD-10-CM | POA: Diagnosis not present

## 2019-12-23 DIAGNOSIS — E46 Unspecified protein-calorie malnutrition: Secondary | ICD-10-CM | POA: Diagnosis not present

## 2019-12-23 DIAGNOSIS — G311 Senile degeneration of brain, not elsewhere classified: Secondary | ICD-10-CM | POA: Diagnosis not present

## 2019-12-23 DIAGNOSIS — G894 Chronic pain syndrome: Secondary | ICD-10-CM | POA: Diagnosis not present

## 2019-12-25 DIAGNOSIS — G311 Senile degeneration of brain, not elsewhere classified: Secondary | ICD-10-CM | POA: Diagnosis not present

## 2019-12-25 DIAGNOSIS — R531 Weakness: Secondary | ICD-10-CM | POA: Diagnosis not present

## 2019-12-25 DIAGNOSIS — E119 Type 2 diabetes mellitus without complications: Secondary | ICD-10-CM | POA: Diagnosis not present

## 2019-12-25 DIAGNOSIS — I10 Essential (primary) hypertension: Secondary | ICD-10-CM | POA: Diagnosis not present

## 2019-12-25 DIAGNOSIS — G894 Chronic pain syndrome: Secondary | ICD-10-CM | POA: Diagnosis not present

## 2019-12-25 DIAGNOSIS — E46 Unspecified protein-calorie malnutrition: Secondary | ICD-10-CM | POA: Diagnosis not present

## 2019-12-28 DIAGNOSIS — G894 Chronic pain syndrome: Secondary | ICD-10-CM | POA: Diagnosis not present

## 2019-12-28 DIAGNOSIS — G311 Senile degeneration of brain, not elsewhere classified: Secondary | ICD-10-CM | POA: Diagnosis not present

## 2019-12-28 DIAGNOSIS — I10 Essential (primary) hypertension: Secondary | ICD-10-CM | POA: Diagnosis not present

## 2019-12-28 DIAGNOSIS — R531 Weakness: Secondary | ICD-10-CM | POA: Diagnosis not present

## 2019-12-28 DIAGNOSIS — E119 Type 2 diabetes mellitus without complications: Secondary | ICD-10-CM | POA: Diagnosis not present

## 2019-12-28 DIAGNOSIS — E46 Unspecified protein-calorie malnutrition: Secondary | ICD-10-CM | POA: Diagnosis not present

## 2019-12-29 DIAGNOSIS — E119 Type 2 diabetes mellitus without complications: Secondary | ICD-10-CM | POA: Diagnosis not present

## 2019-12-29 DIAGNOSIS — I10 Essential (primary) hypertension: Secondary | ICD-10-CM | POA: Diagnosis not present

## 2019-12-29 DIAGNOSIS — G311 Senile degeneration of brain, not elsewhere classified: Secondary | ICD-10-CM | POA: Diagnosis not present

## 2019-12-29 DIAGNOSIS — E46 Unspecified protein-calorie malnutrition: Secondary | ICD-10-CM | POA: Diagnosis not present

## 2019-12-29 DIAGNOSIS — R531 Weakness: Secondary | ICD-10-CM | POA: Diagnosis not present

## 2019-12-29 DIAGNOSIS — G894 Chronic pain syndrome: Secondary | ICD-10-CM | POA: Diagnosis not present

## 2019-12-30 DIAGNOSIS — I10 Essential (primary) hypertension: Secondary | ICD-10-CM | POA: Diagnosis not present

## 2019-12-30 DIAGNOSIS — R531 Weakness: Secondary | ICD-10-CM | POA: Diagnosis not present

## 2019-12-30 DIAGNOSIS — E119 Type 2 diabetes mellitus without complications: Secondary | ICD-10-CM | POA: Diagnosis not present

## 2019-12-30 DIAGNOSIS — E46 Unspecified protein-calorie malnutrition: Secondary | ICD-10-CM | POA: Diagnosis not present

## 2019-12-30 DIAGNOSIS — G894 Chronic pain syndrome: Secondary | ICD-10-CM | POA: Diagnosis not present

## 2019-12-30 DIAGNOSIS — G311 Senile degeneration of brain, not elsewhere classified: Secondary | ICD-10-CM | POA: Diagnosis not present

## 2020-01-01 DIAGNOSIS — G894 Chronic pain syndrome: Secondary | ICD-10-CM | POA: Diagnosis not present

## 2020-01-01 DIAGNOSIS — I10 Essential (primary) hypertension: Secondary | ICD-10-CM | POA: Diagnosis not present

## 2020-01-01 DIAGNOSIS — G311 Senile degeneration of brain, not elsewhere classified: Secondary | ICD-10-CM | POA: Diagnosis not present

## 2020-01-01 DIAGNOSIS — E119 Type 2 diabetes mellitus without complications: Secondary | ICD-10-CM | POA: Diagnosis not present

## 2020-01-01 DIAGNOSIS — R531 Weakness: Secondary | ICD-10-CM | POA: Diagnosis not present

## 2020-01-01 DIAGNOSIS — E46 Unspecified protein-calorie malnutrition: Secondary | ICD-10-CM | POA: Diagnosis not present

## 2020-01-04 DIAGNOSIS — E119 Type 2 diabetes mellitus without complications: Secondary | ICD-10-CM | POA: Diagnosis not present

## 2020-01-04 DIAGNOSIS — I10 Essential (primary) hypertension: Secondary | ICD-10-CM | POA: Diagnosis not present

## 2020-01-04 DIAGNOSIS — G311 Senile degeneration of brain, not elsewhere classified: Secondary | ICD-10-CM | POA: Diagnosis not present

## 2020-01-04 DIAGNOSIS — G894 Chronic pain syndrome: Secondary | ICD-10-CM | POA: Diagnosis not present

## 2020-01-04 DIAGNOSIS — E46 Unspecified protein-calorie malnutrition: Secondary | ICD-10-CM | POA: Diagnosis not present

## 2020-01-04 DIAGNOSIS — R531 Weakness: Secondary | ICD-10-CM | POA: Diagnosis not present

## 2020-01-06 DIAGNOSIS — I10 Essential (primary) hypertension: Secondary | ICD-10-CM | POA: Diagnosis not present

## 2020-01-06 DIAGNOSIS — G311 Senile degeneration of brain, not elsewhere classified: Secondary | ICD-10-CM | POA: Diagnosis not present

## 2020-01-06 DIAGNOSIS — R531 Weakness: Secondary | ICD-10-CM | POA: Diagnosis not present

## 2020-01-06 DIAGNOSIS — E46 Unspecified protein-calorie malnutrition: Secondary | ICD-10-CM | POA: Diagnosis not present

## 2020-01-06 DIAGNOSIS — G894 Chronic pain syndrome: Secondary | ICD-10-CM | POA: Diagnosis not present

## 2020-01-06 DIAGNOSIS — E119 Type 2 diabetes mellitus without complications: Secondary | ICD-10-CM | POA: Diagnosis not present

## 2020-01-08 DIAGNOSIS — I10 Essential (primary) hypertension: Secondary | ICD-10-CM | POA: Diagnosis not present

## 2020-01-08 DIAGNOSIS — G894 Chronic pain syndrome: Secondary | ICD-10-CM | POA: Diagnosis not present

## 2020-01-08 DIAGNOSIS — E119 Type 2 diabetes mellitus without complications: Secondary | ICD-10-CM | POA: Diagnosis not present

## 2020-01-08 DIAGNOSIS — E46 Unspecified protein-calorie malnutrition: Secondary | ICD-10-CM | POA: Diagnosis not present

## 2020-01-08 DIAGNOSIS — R531 Weakness: Secondary | ICD-10-CM | POA: Diagnosis not present

## 2020-01-08 DIAGNOSIS — G311 Senile degeneration of brain, not elsewhere classified: Secondary | ICD-10-CM | POA: Diagnosis not present

## 2020-01-11 DIAGNOSIS — I10 Essential (primary) hypertension: Secondary | ICD-10-CM | POA: Diagnosis not present

## 2020-01-11 DIAGNOSIS — R531 Weakness: Secondary | ICD-10-CM | POA: Diagnosis not present

## 2020-01-11 DIAGNOSIS — E119 Type 2 diabetes mellitus without complications: Secondary | ICD-10-CM | POA: Diagnosis not present

## 2020-01-11 DIAGNOSIS — E46 Unspecified protein-calorie malnutrition: Secondary | ICD-10-CM | POA: Diagnosis not present

## 2020-01-11 DIAGNOSIS — G894 Chronic pain syndrome: Secondary | ICD-10-CM | POA: Diagnosis not present

## 2020-01-11 DIAGNOSIS — G311 Senile degeneration of brain, not elsewhere classified: Secondary | ICD-10-CM | POA: Diagnosis not present

## 2020-01-13 DIAGNOSIS — E119 Type 2 diabetes mellitus without complications: Secondary | ICD-10-CM | POA: Diagnosis not present

## 2020-01-13 DIAGNOSIS — G311 Senile degeneration of brain, not elsewhere classified: Secondary | ICD-10-CM | POA: Diagnosis not present

## 2020-01-13 DIAGNOSIS — E46 Unspecified protein-calorie malnutrition: Secondary | ICD-10-CM | POA: Diagnosis not present

## 2020-01-13 DIAGNOSIS — R531 Weakness: Secondary | ICD-10-CM | POA: Diagnosis not present

## 2020-01-13 DIAGNOSIS — G894 Chronic pain syndrome: Secondary | ICD-10-CM | POA: Diagnosis not present

## 2020-01-13 DIAGNOSIS — I10 Essential (primary) hypertension: Secondary | ICD-10-CM | POA: Diagnosis not present

## 2020-01-15 DIAGNOSIS — G894 Chronic pain syndrome: Secondary | ICD-10-CM | POA: Diagnosis not present

## 2020-01-15 DIAGNOSIS — E46 Unspecified protein-calorie malnutrition: Secondary | ICD-10-CM | POA: Diagnosis not present

## 2020-01-15 DIAGNOSIS — I10 Essential (primary) hypertension: Secondary | ICD-10-CM | POA: Diagnosis not present

## 2020-01-15 DIAGNOSIS — G311 Senile degeneration of brain, not elsewhere classified: Secondary | ICD-10-CM | POA: Diagnosis not present

## 2020-01-15 DIAGNOSIS — R531 Weakness: Secondary | ICD-10-CM | POA: Diagnosis not present

## 2020-01-15 DIAGNOSIS — E119 Type 2 diabetes mellitus without complications: Secondary | ICD-10-CM | POA: Diagnosis not present

## 2020-01-18 DIAGNOSIS — E119 Type 2 diabetes mellitus without complications: Secondary | ICD-10-CM | POA: Diagnosis not present

## 2020-01-18 DIAGNOSIS — E46 Unspecified protein-calorie malnutrition: Secondary | ICD-10-CM | POA: Diagnosis not present

## 2020-01-18 DIAGNOSIS — I10 Essential (primary) hypertension: Secondary | ICD-10-CM | POA: Diagnosis not present

## 2020-01-18 DIAGNOSIS — G894 Chronic pain syndrome: Secondary | ICD-10-CM | POA: Diagnosis not present

## 2020-01-18 DIAGNOSIS — G311 Senile degeneration of brain, not elsewhere classified: Secondary | ICD-10-CM | POA: Diagnosis not present

## 2020-01-18 DIAGNOSIS — R531 Weakness: Secondary | ICD-10-CM | POA: Diagnosis not present

## 2020-01-21 DIAGNOSIS — E119 Type 2 diabetes mellitus without complications: Secondary | ICD-10-CM | POA: Diagnosis not present

## 2020-01-21 DIAGNOSIS — R531 Weakness: Secondary | ICD-10-CM | POA: Diagnosis not present

## 2020-01-21 DIAGNOSIS — G894 Chronic pain syndrome: Secondary | ICD-10-CM | POA: Diagnosis not present

## 2020-01-21 DIAGNOSIS — E46 Unspecified protein-calorie malnutrition: Secondary | ICD-10-CM | POA: Diagnosis not present

## 2020-01-21 DIAGNOSIS — I10 Essential (primary) hypertension: Secondary | ICD-10-CM | POA: Diagnosis not present

## 2020-01-21 DIAGNOSIS — G311 Senile degeneration of brain, not elsewhere classified: Secondary | ICD-10-CM | POA: Diagnosis not present

## 2020-01-22 DIAGNOSIS — E46 Unspecified protein-calorie malnutrition: Secondary | ICD-10-CM | POA: Diagnosis not present

## 2020-01-22 DIAGNOSIS — G311 Senile degeneration of brain, not elsewhere classified: Secondary | ICD-10-CM | POA: Diagnosis not present

## 2020-01-22 DIAGNOSIS — G894 Chronic pain syndrome: Secondary | ICD-10-CM | POA: Diagnosis not present

## 2020-01-22 DIAGNOSIS — R531 Weakness: Secondary | ICD-10-CM | POA: Diagnosis not present

## 2020-01-22 DIAGNOSIS — I10 Essential (primary) hypertension: Secondary | ICD-10-CM | POA: Diagnosis not present

## 2020-01-22 DIAGNOSIS — E119 Type 2 diabetes mellitus without complications: Secondary | ICD-10-CM | POA: Diagnosis not present

## 2020-01-25 DIAGNOSIS — G894 Chronic pain syndrome: Secondary | ICD-10-CM | POA: Diagnosis not present

## 2020-01-25 DIAGNOSIS — I10 Essential (primary) hypertension: Secondary | ICD-10-CM | POA: Diagnosis not present

## 2020-01-25 DIAGNOSIS — E119 Type 2 diabetes mellitus without complications: Secondary | ICD-10-CM | POA: Diagnosis not present

## 2020-01-25 DIAGNOSIS — R531 Weakness: Secondary | ICD-10-CM | POA: Diagnosis not present

## 2020-01-25 DIAGNOSIS — E46 Unspecified protein-calorie malnutrition: Secondary | ICD-10-CM | POA: Diagnosis not present

## 2020-01-25 DIAGNOSIS — G311 Senile degeneration of brain, not elsewhere classified: Secondary | ICD-10-CM | POA: Diagnosis not present

## 2020-01-27 DIAGNOSIS — E119 Type 2 diabetes mellitus without complications: Secondary | ICD-10-CM | POA: Diagnosis not present

## 2020-01-27 DIAGNOSIS — E46 Unspecified protein-calorie malnutrition: Secondary | ICD-10-CM | POA: Diagnosis not present

## 2020-01-27 DIAGNOSIS — I10 Essential (primary) hypertension: Secondary | ICD-10-CM | POA: Diagnosis not present

## 2020-01-27 DIAGNOSIS — G311 Senile degeneration of brain, not elsewhere classified: Secondary | ICD-10-CM | POA: Diagnosis not present

## 2020-01-27 DIAGNOSIS — R531 Weakness: Secondary | ICD-10-CM | POA: Diagnosis not present

## 2020-01-27 DIAGNOSIS — G894 Chronic pain syndrome: Secondary | ICD-10-CM | POA: Diagnosis not present

## 2020-01-28 DIAGNOSIS — G894 Chronic pain syndrome: Secondary | ICD-10-CM | POA: Diagnosis not present

## 2020-01-28 DIAGNOSIS — I10 Essential (primary) hypertension: Secondary | ICD-10-CM | POA: Diagnosis not present

## 2020-01-28 DIAGNOSIS — Z7401 Bed confinement status: Secondary | ICD-10-CM | POA: Diagnosis not present

## 2020-01-28 DIAGNOSIS — R531 Weakness: Secondary | ICD-10-CM | POA: Diagnosis not present

## 2020-01-28 DIAGNOSIS — G311 Senile degeneration of brain, not elsewhere classified: Secondary | ICD-10-CM | POA: Diagnosis not present

## 2020-01-28 DIAGNOSIS — E46 Unspecified protein-calorie malnutrition: Secondary | ICD-10-CM | POA: Diagnosis not present

## 2020-01-28 DIAGNOSIS — R69 Illness, unspecified: Secondary | ICD-10-CM | POA: Diagnosis not present

## 2020-01-28 DIAGNOSIS — E119 Type 2 diabetes mellitus without complications: Secondary | ICD-10-CM | POA: Diagnosis not present

## 2020-01-29 DIAGNOSIS — G894 Chronic pain syndrome: Secondary | ICD-10-CM | POA: Diagnosis not present

## 2020-01-29 DIAGNOSIS — E119 Type 2 diabetes mellitus without complications: Secondary | ICD-10-CM | POA: Diagnosis not present

## 2020-01-29 DIAGNOSIS — G311 Senile degeneration of brain, not elsewhere classified: Secondary | ICD-10-CM | POA: Diagnosis not present

## 2020-01-29 DIAGNOSIS — R531 Weakness: Secondary | ICD-10-CM | POA: Diagnosis not present

## 2020-01-29 DIAGNOSIS — E46 Unspecified protein-calorie malnutrition: Secondary | ICD-10-CM | POA: Diagnosis not present

## 2020-01-29 DIAGNOSIS — I10 Essential (primary) hypertension: Secondary | ICD-10-CM | POA: Diagnosis not present

## 2020-01-30 DIAGNOSIS — G894 Chronic pain syndrome: Secondary | ICD-10-CM | POA: Diagnosis not present

## 2020-01-30 DIAGNOSIS — E46 Unspecified protein-calorie malnutrition: Secondary | ICD-10-CM | POA: Diagnosis not present

## 2020-01-30 DIAGNOSIS — E119 Type 2 diabetes mellitus without complications: Secondary | ICD-10-CM | POA: Diagnosis not present

## 2020-01-30 DIAGNOSIS — I10 Essential (primary) hypertension: Secondary | ICD-10-CM | POA: Diagnosis not present

## 2020-01-30 DIAGNOSIS — G311 Senile degeneration of brain, not elsewhere classified: Secondary | ICD-10-CM | POA: Diagnosis not present

## 2020-01-30 DIAGNOSIS — R531 Weakness: Secondary | ICD-10-CM | POA: Diagnosis not present

## 2020-01-31 DIAGNOSIS — E46 Unspecified protein-calorie malnutrition: Secondary | ICD-10-CM | POA: Diagnosis not present

## 2020-01-31 DIAGNOSIS — E119 Type 2 diabetes mellitus without complications: Secondary | ICD-10-CM | POA: Diagnosis not present

## 2020-01-31 DIAGNOSIS — I10 Essential (primary) hypertension: Secondary | ICD-10-CM | POA: Diagnosis not present

## 2020-01-31 DIAGNOSIS — G894 Chronic pain syndrome: Secondary | ICD-10-CM | POA: Diagnosis not present

## 2020-01-31 DIAGNOSIS — R531 Weakness: Secondary | ICD-10-CM | POA: Diagnosis not present

## 2020-01-31 DIAGNOSIS — G311 Senile degeneration of brain, not elsewhere classified: Secondary | ICD-10-CM | POA: Diagnosis not present

## 2020-02-01 DIAGNOSIS — I10 Essential (primary) hypertension: Secondary | ICD-10-CM | POA: Diagnosis not present

## 2020-02-01 DIAGNOSIS — R531 Weakness: Secondary | ICD-10-CM | POA: Diagnosis not present

## 2020-02-01 DIAGNOSIS — G894 Chronic pain syndrome: Secondary | ICD-10-CM | POA: Diagnosis not present

## 2020-02-01 DIAGNOSIS — E46 Unspecified protein-calorie malnutrition: Secondary | ICD-10-CM | POA: Diagnosis not present

## 2020-02-01 DIAGNOSIS — G311 Senile degeneration of brain, not elsewhere classified: Secondary | ICD-10-CM | POA: Diagnosis not present

## 2020-02-01 DIAGNOSIS — E119 Type 2 diabetes mellitus without complications: Secondary | ICD-10-CM | POA: Diagnosis not present

## 2020-02-03 DIAGNOSIS — E119 Type 2 diabetes mellitus without complications: Secondary | ICD-10-CM | POA: Diagnosis not present

## 2020-02-03 DIAGNOSIS — R531 Weakness: Secondary | ICD-10-CM | POA: Diagnosis not present

## 2020-02-03 DIAGNOSIS — G311 Senile degeneration of brain, not elsewhere classified: Secondary | ICD-10-CM | POA: Diagnosis not present

## 2020-02-03 DIAGNOSIS — G894 Chronic pain syndrome: Secondary | ICD-10-CM | POA: Diagnosis not present

## 2020-02-03 DIAGNOSIS — E46 Unspecified protein-calorie malnutrition: Secondary | ICD-10-CM | POA: Diagnosis not present

## 2020-02-03 DIAGNOSIS — I10 Essential (primary) hypertension: Secondary | ICD-10-CM | POA: Diagnosis not present

## 2020-02-05 DIAGNOSIS — E46 Unspecified protein-calorie malnutrition: Secondary | ICD-10-CM | POA: Diagnosis not present

## 2020-02-05 DIAGNOSIS — R531 Weakness: Secondary | ICD-10-CM | POA: Diagnosis not present

## 2020-02-05 DIAGNOSIS — G311 Senile degeneration of brain, not elsewhere classified: Secondary | ICD-10-CM | POA: Diagnosis not present

## 2020-02-05 DIAGNOSIS — I10 Essential (primary) hypertension: Secondary | ICD-10-CM | POA: Diagnosis not present

## 2020-02-05 DIAGNOSIS — G894 Chronic pain syndrome: Secondary | ICD-10-CM | POA: Diagnosis not present

## 2020-02-05 DIAGNOSIS — E119 Type 2 diabetes mellitus without complications: Secondary | ICD-10-CM | POA: Diagnosis not present

## 2020-02-08 DIAGNOSIS — E46 Unspecified protein-calorie malnutrition: Secondary | ICD-10-CM | POA: Diagnosis not present

## 2020-02-08 DIAGNOSIS — G894 Chronic pain syndrome: Secondary | ICD-10-CM | POA: Diagnosis not present

## 2020-02-08 DIAGNOSIS — R531 Weakness: Secondary | ICD-10-CM | POA: Diagnosis not present

## 2020-02-08 DIAGNOSIS — G311 Senile degeneration of brain, not elsewhere classified: Secondary | ICD-10-CM | POA: Diagnosis not present

## 2020-02-08 DIAGNOSIS — E119 Type 2 diabetes mellitus without complications: Secondary | ICD-10-CM | POA: Diagnosis not present

## 2020-02-08 DIAGNOSIS — I10 Essential (primary) hypertension: Secondary | ICD-10-CM | POA: Diagnosis not present

## 2020-02-10 DIAGNOSIS — I10 Essential (primary) hypertension: Secondary | ICD-10-CM | POA: Diagnosis not present

## 2020-02-10 DIAGNOSIS — G894 Chronic pain syndrome: Secondary | ICD-10-CM | POA: Diagnosis not present

## 2020-02-10 DIAGNOSIS — G311 Senile degeneration of brain, not elsewhere classified: Secondary | ICD-10-CM | POA: Diagnosis not present

## 2020-02-10 DIAGNOSIS — E46 Unspecified protein-calorie malnutrition: Secondary | ICD-10-CM | POA: Diagnosis not present

## 2020-02-10 DIAGNOSIS — R531 Weakness: Secondary | ICD-10-CM | POA: Diagnosis not present

## 2020-02-10 DIAGNOSIS — E119 Type 2 diabetes mellitus without complications: Secondary | ICD-10-CM | POA: Diagnosis not present

## 2020-02-12 DIAGNOSIS — R531 Weakness: Secondary | ICD-10-CM | POA: Diagnosis not present

## 2020-02-12 DIAGNOSIS — G311 Senile degeneration of brain, not elsewhere classified: Secondary | ICD-10-CM | POA: Diagnosis not present

## 2020-02-12 DIAGNOSIS — E119 Type 2 diabetes mellitus without complications: Secondary | ICD-10-CM | POA: Diagnosis not present

## 2020-02-12 DIAGNOSIS — E46 Unspecified protein-calorie malnutrition: Secondary | ICD-10-CM | POA: Diagnosis not present

## 2020-02-12 DIAGNOSIS — G894 Chronic pain syndrome: Secondary | ICD-10-CM | POA: Diagnosis not present

## 2020-02-12 DIAGNOSIS — I10 Essential (primary) hypertension: Secondary | ICD-10-CM | POA: Diagnosis not present

## 2020-02-15 DIAGNOSIS — I10 Essential (primary) hypertension: Secondary | ICD-10-CM | POA: Diagnosis not present

## 2020-02-15 DIAGNOSIS — G894 Chronic pain syndrome: Secondary | ICD-10-CM | POA: Diagnosis not present

## 2020-02-15 DIAGNOSIS — E119 Type 2 diabetes mellitus without complications: Secondary | ICD-10-CM | POA: Diagnosis not present

## 2020-02-15 DIAGNOSIS — E46 Unspecified protein-calorie malnutrition: Secondary | ICD-10-CM | POA: Diagnosis not present

## 2020-02-15 DIAGNOSIS — R531 Weakness: Secondary | ICD-10-CM | POA: Diagnosis not present

## 2020-02-15 DIAGNOSIS — G311 Senile degeneration of brain, not elsewhere classified: Secondary | ICD-10-CM | POA: Diagnosis not present

## 2020-02-19 DIAGNOSIS — R531 Weakness: Secondary | ICD-10-CM | POA: Diagnosis not present

## 2020-02-19 DIAGNOSIS — E46 Unspecified protein-calorie malnutrition: Secondary | ICD-10-CM | POA: Diagnosis not present

## 2020-02-19 DIAGNOSIS — G311 Senile degeneration of brain, not elsewhere classified: Secondary | ICD-10-CM | POA: Diagnosis not present

## 2020-02-19 DIAGNOSIS — I10 Essential (primary) hypertension: Secondary | ICD-10-CM | POA: Diagnosis not present

## 2020-02-19 DIAGNOSIS — E119 Type 2 diabetes mellitus without complications: Secondary | ICD-10-CM | POA: Diagnosis not present

## 2020-02-19 DIAGNOSIS — G894 Chronic pain syndrome: Secondary | ICD-10-CM | POA: Diagnosis not present

## 2020-02-20 DIAGNOSIS — I10 Essential (primary) hypertension: Secondary | ICD-10-CM | POA: Diagnosis not present

## 2020-02-20 DIAGNOSIS — R531 Weakness: Secondary | ICD-10-CM | POA: Diagnosis not present

## 2020-02-20 DIAGNOSIS — G311 Senile degeneration of brain, not elsewhere classified: Secondary | ICD-10-CM | POA: Diagnosis not present

## 2020-02-20 DIAGNOSIS — E119 Type 2 diabetes mellitus without complications: Secondary | ICD-10-CM | POA: Diagnosis not present

## 2020-02-20 DIAGNOSIS — G894 Chronic pain syndrome: Secondary | ICD-10-CM | POA: Diagnosis not present

## 2020-02-20 DIAGNOSIS — E46 Unspecified protein-calorie malnutrition: Secondary | ICD-10-CM | POA: Diagnosis not present

## 2020-02-22 DIAGNOSIS — E119 Type 2 diabetes mellitus without complications: Secondary | ICD-10-CM | POA: Diagnosis not present

## 2020-02-22 DIAGNOSIS — G894 Chronic pain syndrome: Secondary | ICD-10-CM | POA: Diagnosis not present

## 2020-02-22 DIAGNOSIS — G311 Senile degeneration of brain, not elsewhere classified: Secondary | ICD-10-CM | POA: Diagnosis not present

## 2020-02-22 DIAGNOSIS — I10 Essential (primary) hypertension: Secondary | ICD-10-CM | POA: Diagnosis not present

## 2020-02-22 DIAGNOSIS — E46 Unspecified protein-calorie malnutrition: Secondary | ICD-10-CM | POA: Diagnosis not present

## 2020-02-22 DIAGNOSIS — R531 Weakness: Secondary | ICD-10-CM | POA: Diagnosis not present

## 2020-02-24 DIAGNOSIS — E119 Type 2 diabetes mellitus without complications: Secondary | ICD-10-CM | POA: Diagnosis not present

## 2020-02-24 DIAGNOSIS — R531 Weakness: Secondary | ICD-10-CM | POA: Diagnosis not present

## 2020-02-24 DIAGNOSIS — G894 Chronic pain syndrome: Secondary | ICD-10-CM | POA: Diagnosis not present

## 2020-02-24 DIAGNOSIS — G311 Senile degeneration of brain, not elsewhere classified: Secondary | ICD-10-CM | POA: Diagnosis not present

## 2020-02-24 DIAGNOSIS — I10 Essential (primary) hypertension: Secondary | ICD-10-CM | POA: Diagnosis not present

## 2020-02-24 DIAGNOSIS — E46 Unspecified protein-calorie malnutrition: Secondary | ICD-10-CM | POA: Diagnosis not present

## 2020-02-26 DIAGNOSIS — E119 Type 2 diabetes mellitus without complications: Secondary | ICD-10-CM | POA: Diagnosis not present

## 2020-02-26 DIAGNOSIS — E46 Unspecified protein-calorie malnutrition: Secondary | ICD-10-CM | POA: Diagnosis not present

## 2020-02-26 DIAGNOSIS — G311 Senile degeneration of brain, not elsewhere classified: Secondary | ICD-10-CM | POA: Diagnosis not present

## 2020-02-26 DIAGNOSIS — R531 Weakness: Secondary | ICD-10-CM | POA: Diagnosis not present

## 2020-02-26 DIAGNOSIS — G894 Chronic pain syndrome: Secondary | ICD-10-CM | POA: Diagnosis not present

## 2020-02-26 DIAGNOSIS — I10 Essential (primary) hypertension: Secondary | ICD-10-CM | POA: Diagnosis not present

## 2020-02-29 DIAGNOSIS — R531 Weakness: Secondary | ICD-10-CM | POA: Diagnosis not present

## 2020-02-29 DIAGNOSIS — G311 Senile degeneration of brain, not elsewhere classified: Secondary | ICD-10-CM | POA: Diagnosis not present

## 2020-02-29 DIAGNOSIS — E46 Unspecified protein-calorie malnutrition: Secondary | ICD-10-CM | POA: Diagnosis not present

## 2020-02-29 DIAGNOSIS — G894 Chronic pain syndrome: Secondary | ICD-10-CM | POA: Diagnosis not present

## 2020-02-29 DIAGNOSIS — I10 Essential (primary) hypertension: Secondary | ICD-10-CM | POA: Diagnosis not present

## 2020-02-29 DIAGNOSIS — E119 Type 2 diabetes mellitus without complications: Secondary | ICD-10-CM | POA: Diagnosis not present

## 2020-03-02 DIAGNOSIS — I10 Essential (primary) hypertension: Secondary | ICD-10-CM | POA: Diagnosis not present

## 2020-03-02 DIAGNOSIS — E119 Type 2 diabetes mellitus without complications: Secondary | ICD-10-CM | POA: Diagnosis not present

## 2020-03-02 DIAGNOSIS — R531 Weakness: Secondary | ICD-10-CM | POA: Diagnosis not present

## 2020-03-02 DIAGNOSIS — E46 Unspecified protein-calorie malnutrition: Secondary | ICD-10-CM | POA: Diagnosis not present

## 2020-03-02 DIAGNOSIS — G311 Senile degeneration of brain, not elsewhere classified: Secondary | ICD-10-CM | POA: Diagnosis not present

## 2020-03-02 DIAGNOSIS — G894 Chronic pain syndrome: Secondary | ICD-10-CM | POA: Diagnosis not present

## 2020-03-03 DIAGNOSIS — R531 Weakness: Secondary | ICD-10-CM | POA: Diagnosis not present

## 2020-03-03 DIAGNOSIS — E46 Unspecified protein-calorie malnutrition: Secondary | ICD-10-CM | POA: Diagnosis not present

## 2020-03-03 DIAGNOSIS — G894 Chronic pain syndrome: Secondary | ICD-10-CM | POA: Diagnosis not present

## 2020-03-03 DIAGNOSIS — G311 Senile degeneration of brain, not elsewhere classified: Secondary | ICD-10-CM | POA: Diagnosis not present

## 2020-03-03 DIAGNOSIS — I10 Essential (primary) hypertension: Secondary | ICD-10-CM | POA: Diagnosis not present

## 2020-03-03 DIAGNOSIS — E119 Type 2 diabetes mellitus without complications: Secondary | ICD-10-CM | POA: Diagnosis not present

## 2020-03-04 DIAGNOSIS — I10 Essential (primary) hypertension: Secondary | ICD-10-CM | POA: Diagnosis not present

## 2020-03-04 DIAGNOSIS — E119 Type 2 diabetes mellitus without complications: Secondary | ICD-10-CM | POA: Diagnosis not present

## 2020-03-04 DIAGNOSIS — E46 Unspecified protein-calorie malnutrition: Secondary | ICD-10-CM | POA: Diagnosis not present

## 2020-03-04 DIAGNOSIS — R531 Weakness: Secondary | ICD-10-CM | POA: Diagnosis not present

## 2020-03-04 DIAGNOSIS — G894 Chronic pain syndrome: Secondary | ICD-10-CM | POA: Diagnosis not present

## 2020-03-04 DIAGNOSIS — G311 Senile degeneration of brain, not elsewhere classified: Secondary | ICD-10-CM | POA: Diagnosis not present

## 2020-03-07 DIAGNOSIS — I10 Essential (primary) hypertension: Secondary | ICD-10-CM | POA: Diagnosis not present

## 2020-03-07 DIAGNOSIS — E46 Unspecified protein-calorie malnutrition: Secondary | ICD-10-CM | POA: Diagnosis not present

## 2020-03-07 DIAGNOSIS — R531 Weakness: Secondary | ICD-10-CM | POA: Diagnosis not present

## 2020-03-07 DIAGNOSIS — G894 Chronic pain syndrome: Secondary | ICD-10-CM | POA: Diagnosis not present

## 2020-03-07 DIAGNOSIS — E119 Type 2 diabetes mellitus without complications: Secondary | ICD-10-CM | POA: Diagnosis not present

## 2020-03-07 DIAGNOSIS — G311 Senile degeneration of brain, not elsewhere classified: Secondary | ICD-10-CM | POA: Diagnosis not present

## 2020-03-08 DIAGNOSIS — R531 Weakness: Secondary | ICD-10-CM | POA: Diagnosis not present

## 2020-03-08 DIAGNOSIS — E46 Unspecified protein-calorie malnutrition: Secondary | ICD-10-CM | POA: Diagnosis not present

## 2020-03-08 DIAGNOSIS — I10 Essential (primary) hypertension: Secondary | ICD-10-CM | POA: Diagnosis not present

## 2020-03-08 DIAGNOSIS — E119 Type 2 diabetes mellitus without complications: Secondary | ICD-10-CM | POA: Diagnosis not present

## 2020-03-08 DIAGNOSIS — G311 Senile degeneration of brain, not elsewhere classified: Secondary | ICD-10-CM | POA: Diagnosis not present

## 2020-03-08 DIAGNOSIS — G894 Chronic pain syndrome: Secondary | ICD-10-CM | POA: Diagnosis not present

## 2020-03-09 DIAGNOSIS — E46 Unspecified protein-calorie malnutrition: Secondary | ICD-10-CM | POA: Diagnosis not present

## 2020-03-09 DIAGNOSIS — I10 Essential (primary) hypertension: Secondary | ICD-10-CM | POA: Diagnosis not present

## 2020-03-09 DIAGNOSIS — E119 Type 2 diabetes mellitus without complications: Secondary | ICD-10-CM | POA: Diagnosis not present

## 2020-03-09 DIAGNOSIS — G311 Senile degeneration of brain, not elsewhere classified: Secondary | ICD-10-CM | POA: Diagnosis not present

## 2020-03-09 DIAGNOSIS — G894 Chronic pain syndrome: Secondary | ICD-10-CM | POA: Diagnosis not present

## 2020-03-09 DIAGNOSIS — R531 Weakness: Secondary | ICD-10-CM | POA: Diagnosis not present

## 2020-03-11 DIAGNOSIS — G311 Senile degeneration of brain, not elsewhere classified: Secondary | ICD-10-CM | POA: Diagnosis not present

## 2020-03-11 DIAGNOSIS — R531 Weakness: Secondary | ICD-10-CM | POA: Diagnosis not present

## 2020-03-11 DIAGNOSIS — E119 Type 2 diabetes mellitus without complications: Secondary | ICD-10-CM | POA: Diagnosis not present

## 2020-03-11 DIAGNOSIS — G894 Chronic pain syndrome: Secondary | ICD-10-CM | POA: Diagnosis not present

## 2020-03-11 DIAGNOSIS — E46 Unspecified protein-calorie malnutrition: Secondary | ICD-10-CM | POA: Diagnosis not present

## 2020-03-11 DIAGNOSIS — I10 Essential (primary) hypertension: Secondary | ICD-10-CM | POA: Diagnosis not present

## 2020-03-14 ENCOUNTER — Other Ambulatory Visit: Payer: Self-pay | Admitting: Neurology

## 2020-03-14 DIAGNOSIS — I10 Essential (primary) hypertension: Secondary | ICD-10-CM | POA: Diagnosis not present

## 2020-03-14 DIAGNOSIS — G894 Chronic pain syndrome: Secondary | ICD-10-CM | POA: Diagnosis not present

## 2020-03-14 DIAGNOSIS — E46 Unspecified protein-calorie malnutrition: Secondary | ICD-10-CM | POA: Diagnosis not present

## 2020-03-14 DIAGNOSIS — F419 Anxiety disorder, unspecified: Secondary | ICD-10-CM

## 2020-03-14 DIAGNOSIS — R531 Weakness: Secondary | ICD-10-CM | POA: Diagnosis not present

## 2020-03-14 DIAGNOSIS — G311 Senile degeneration of brain, not elsewhere classified: Secondary | ICD-10-CM | POA: Diagnosis not present

## 2020-03-14 DIAGNOSIS — E119 Type 2 diabetes mellitus without complications: Secondary | ICD-10-CM | POA: Diagnosis not present

## 2020-03-16 DIAGNOSIS — I10 Essential (primary) hypertension: Secondary | ICD-10-CM | POA: Diagnosis not present

## 2020-03-16 DIAGNOSIS — E46 Unspecified protein-calorie malnutrition: Secondary | ICD-10-CM | POA: Diagnosis not present

## 2020-03-16 DIAGNOSIS — G894 Chronic pain syndrome: Secondary | ICD-10-CM | POA: Diagnosis not present

## 2020-03-16 DIAGNOSIS — G311 Senile degeneration of brain, not elsewhere classified: Secondary | ICD-10-CM | POA: Diagnosis not present

## 2020-03-16 DIAGNOSIS — E119 Type 2 diabetes mellitus without complications: Secondary | ICD-10-CM | POA: Diagnosis not present

## 2020-03-16 DIAGNOSIS — R531 Weakness: Secondary | ICD-10-CM | POA: Diagnosis not present

## 2020-03-21 DIAGNOSIS — G894 Chronic pain syndrome: Secondary | ICD-10-CM | POA: Diagnosis not present

## 2020-03-21 DIAGNOSIS — R531 Weakness: Secondary | ICD-10-CM | POA: Diagnosis not present

## 2020-03-21 DIAGNOSIS — E46 Unspecified protein-calorie malnutrition: Secondary | ICD-10-CM | POA: Diagnosis not present

## 2020-03-21 DIAGNOSIS — E119 Type 2 diabetes mellitus without complications: Secondary | ICD-10-CM | POA: Diagnosis not present

## 2020-03-21 DIAGNOSIS — I10 Essential (primary) hypertension: Secondary | ICD-10-CM | POA: Diagnosis not present

## 2020-03-21 DIAGNOSIS — G311 Senile degeneration of brain, not elsewhere classified: Secondary | ICD-10-CM | POA: Diagnosis not present

## 2020-03-22 DIAGNOSIS — G311 Senile degeneration of brain, not elsewhere classified: Secondary | ICD-10-CM | POA: Diagnosis not present

## 2020-03-22 DIAGNOSIS — E46 Unspecified protein-calorie malnutrition: Secondary | ICD-10-CM | POA: Diagnosis not present

## 2020-03-22 DIAGNOSIS — E119 Type 2 diabetes mellitus without complications: Secondary | ICD-10-CM | POA: Diagnosis not present

## 2020-03-22 DIAGNOSIS — R531 Weakness: Secondary | ICD-10-CM | POA: Diagnosis not present

## 2020-03-22 DIAGNOSIS — I10 Essential (primary) hypertension: Secondary | ICD-10-CM | POA: Diagnosis not present

## 2020-03-22 DIAGNOSIS — G894 Chronic pain syndrome: Secondary | ICD-10-CM | POA: Diagnosis not present

## 2020-03-25 DIAGNOSIS — G311 Senile degeneration of brain, not elsewhere classified: Secondary | ICD-10-CM | POA: Diagnosis not present

## 2020-03-25 DIAGNOSIS — G894 Chronic pain syndrome: Secondary | ICD-10-CM | POA: Diagnosis not present

## 2020-03-25 DIAGNOSIS — I10 Essential (primary) hypertension: Secondary | ICD-10-CM | POA: Diagnosis not present

## 2020-03-25 DIAGNOSIS — E46 Unspecified protein-calorie malnutrition: Secondary | ICD-10-CM | POA: Diagnosis not present

## 2020-03-25 DIAGNOSIS — R531 Weakness: Secondary | ICD-10-CM | POA: Diagnosis not present

## 2020-03-25 DIAGNOSIS — E119 Type 2 diabetes mellitus without complications: Secondary | ICD-10-CM | POA: Diagnosis not present

## 2020-03-28 DIAGNOSIS — G311 Senile degeneration of brain, not elsewhere classified: Secondary | ICD-10-CM | POA: Diagnosis not present

## 2020-03-28 DIAGNOSIS — E119 Type 2 diabetes mellitus without complications: Secondary | ICD-10-CM | POA: Diagnosis not present

## 2020-03-28 DIAGNOSIS — R531 Weakness: Secondary | ICD-10-CM | POA: Diagnosis not present

## 2020-03-28 DIAGNOSIS — I10 Essential (primary) hypertension: Secondary | ICD-10-CM | POA: Diagnosis not present

## 2020-03-28 DIAGNOSIS — E46 Unspecified protein-calorie malnutrition: Secondary | ICD-10-CM | POA: Diagnosis not present

## 2020-03-28 DIAGNOSIS — G894 Chronic pain syndrome: Secondary | ICD-10-CM | POA: Diagnosis not present

## 2020-03-30 DIAGNOSIS — I10 Essential (primary) hypertension: Secondary | ICD-10-CM | POA: Diagnosis not present

## 2020-03-30 DIAGNOSIS — G894 Chronic pain syndrome: Secondary | ICD-10-CM | POA: Diagnosis not present

## 2020-03-30 DIAGNOSIS — E119 Type 2 diabetes mellitus without complications: Secondary | ICD-10-CM | POA: Diagnosis not present

## 2020-03-30 DIAGNOSIS — E46 Unspecified protein-calorie malnutrition: Secondary | ICD-10-CM | POA: Diagnosis not present

## 2020-03-30 DIAGNOSIS — R531 Weakness: Secondary | ICD-10-CM | POA: Diagnosis not present

## 2020-03-30 DIAGNOSIS — G311 Senile degeneration of brain, not elsewhere classified: Secondary | ICD-10-CM | POA: Diagnosis not present

## 2020-04-01 DIAGNOSIS — R531 Weakness: Secondary | ICD-10-CM | POA: Diagnosis not present

## 2020-04-01 DIAGNOSIS — E119 Type 2 diabetes mellitus without complications: Secondary | ICD-10-CM | POA: Diagnosis not present

## 2020-04-01 DIAGNOSIS — I10 Essential (primary) hypertension: Secondary | ICD-10-CM | POA: Diagnosis not present

## 2020-04-01 DIAGNOSIS — E46 Unspecified protein-calorie malnutrition: Secondary | ICD-10-CM | POA: Diagnosis not present

## 2020-04-01 DIAGNOSIS — G311 Senile degeneration of brain, not elsewhere classified: Secondary | ICD-10-CM | POA: Diagnosis not present

## 2020-04-01 DIAGNOSIS — G894 Chronic pain syndrome: Secondary | ICD-10-CM | POA: Diagnosis not present

## 2020-04-04 DIAGNOSIS — G311 Senile degeneration of brain, not elsewhere classified: Secondary | ICD-10-CM | POA: Diagnosis not present

## 2020-04-04 DIAGNOSIS — E119 Type 2 diabetes mellitus without complications: Secondary | ICD-10-CM | POA: Diagnosis not present

## 2020-04-04 DIAGNOSIS — R531 Weakness: Secondary | ICD-10-CM | POA: Diagnosis not present

## 2020-04-04 DIAGNOSIS — I10 Essential (primary) hypertension: Secondary | ICD-10-CM | POA: Diagnosis not present

## 2020-04-04 DIAGNOSIS — E46 Unspecified protein-calorie malnutrition: Secondary | ICD-10-CM | POA: Diagnosis not present

## 2020-04-04 DIAGNOSIS — G894 Chronic pain syndrome: Secondary | ICD-10-CM | POA: Diagnosis not present

## 2020-04-06 DIAGNOSIS — R531 Weakness: Secondary | ICD-10-CM | POA: Diagnosis not present

## 2020-04-06 DIAGNOSIS — G894 Chronic pain syndrome: Secondary | ICD-10-CM | POA: Diagnosis not present

## 2020-04-06 DIAGNOSIS — I10 Essential (primary) hypertension: Secondary | ICD-10-CM | POA: Diagnosis not present

## 2020-04-06 DIAGNOSIS — G311 Senile degeneration of brain, not elsewhere classified: Secondary | ICD-10-CM | POA: Diagnosis not present

## 2020-04-06 DIAGNOSIS — E119 Type 2 diabetes mellitus without complications: Secondary | ICD-10-CM | POA: Diagnosis not present

## 2020-04-06 DIAGNOSIS — E46 Unspecified protein-calorie malnutrition: Secondary | ICD-10-CM | POA: Diagnosis not present

## 2020-04-08 DIAGNOSIS — G311 Senile degeneration of brain, not elsewhere classified: Secondary | ICD-10-CM | POA: Diagnosis not present

## 2020-04-08 DIAGNOSIS — E119 Type 2 diabetes mellitus without complications: Secondary | ICD-10-CM | POA: Diagnosis not present

## 2020-04-08 DIAGNOSIS — I10 Essential (primary) hypertension: Secondary | ICD-10-CM | POA: Diagnosis not present

## 2020-04-08 DIAGNOSIS — G894 Chronic pain syndrome: Secondary | ICD-10-CM | POA: Diagnosis not present

## 2020-04-08 DIAGNOSIS — R531 Weakness: Secondary | ICD-10-CM | POA: Diagnosis not present

## 2020-04-08 DIAGNOSIS — E46 Unspecified protein-calorie malnutrition: Secondary | ICD-10-CM | POA: Diagnosis not present

## 2020-04-11 DIAGNOSIS — E46 Unspecified protein-calorie malnutrition: Secondary | ICD-10-CM | POA: Diagnosis not present

## 2020-04-11 DIAGNOSIS — E119 Type 2 diabetes mellitus without complications: Secondary | ICD-10-CM | POA: Diagnosis not present

## 2020-04-11 DIAGNOSIS — G894 Chronic pain syndrome: Secondary | ICD-10-CM | POA: Diagnosis not present

## 2020-04-11 DIAGNOSIS — G311 Senile degeneration of brain, not elsewhere classified: Secondary | ICD-10-CM | POA: Diagnosis not present

## 2020-04-11 DIAGNOSIS — R531 Weakness: Secondary | ICD-10-CM | POA: Diagnosis not present

## 2020-04-11 DIAGNOSIS — I10 Essential (primary) hypertension: Secondary | ICD-10-CM | POA: Diagnosis not present

## 2020-04-12 DIAGNOSIS — R531 Weakness: Secondary | ICD-10-CM | POA: Diagnosis not present

## 2020-04-12 DIAGNOSIS — E119 Type 2 diabetes mellitus without complications: Secondary | ICD-10-CM | POA: Diagnosis not present

## 2020-04-12 DIAGNOSIS — G311 Senile degeneration of brain, not elsewhere classified: Secondary | ICD-10-CM | POA: Diagnosis not present

## 2020-04-12 DIAGNOSIS — G894 Chronic pain syndrome: Secondary | ICD-10-CM | POA: Diagnosis not present

## 2020-04-12 DIAGNOSIS — E46 Unspecified protein-calorie malnutrition: Secondary | ICD-10-CM | POA: Diagnosis not present

## 2020-04-12 DIAGNOSIS — I10 Essential (primary) hypertension: Secondary | ICD-10-CM | POA: Diagnosis not present

## 2020-04-13 ENCOUNTER — Ambulatory Visit: Payer: PRIVATE HEALTH INSURANCE | Admitting: Neurology

## 2020-04-13 DIAGNOSIS — E119 Type 2 diabetes mellitus without complications: Secondary | ICD-10-CM | POA: Diagnosis not present

## 2020-04-13 DIAGNOSIS — R531 Weakness: Secondary | ICD-10-CM | POA: Diagnosis not present

## 2020-04-13 DIAGNOSIS — G311 Senile degeneration of brain, not elsewhere classified: Secondary | ICD-10-CM | POA: Diagnosis not present

## 2020-04-13 DIAGNOSIS — E46 Unspecified protein-calorie malnutrition: Secondary | ICD-10-CM | POA: Diagnosis not present

## 2020-04-13 DIAGNOSIS — I10 Essential (primary) hypertension: Secondary | ICD-10-CM | POA: Diagnosis not present

## 2020-04-13 DIAGNOSIS — G894 Chronic pain syndrome: Secondary | ICD-10-CM | POA: Diagnosis not present

## 2020-04-17 DIAGNOSIS — G311 Senile degeneration of brain, not elsewhere classified: Secondary | ICD-10-CM | POA: Diagnosis not present

## 2020-04-17 DIAGNOSIS — G894 Chronic pain syndrome: Secondary | ICD-10-CM | POA: Diagnosis not present

## 2020-04-17 DIAGNOSIS — I10 Essential (primary) hypertension: Secondary | ICD-10-CM | POA: Diagnosis not present

## 2020-04-17 DIAGNOSIS — E46 Unspecified protein-calorie malnutrition: Secondary | ICD-10-CM | POA: Diagnosis not present

## 2020-04-17 DIAGNOSIS — E119 Type 2 diabetes mellitus without complications: Secondary | ICD-10-CM | POA: Diagnosis not present

## 2020-04-17 DIAGNOSIS — R531 Weakness: Secondary | ICD-10-CM | POA: Diagnosis not present

## 2020-04-18 DIAGNOSIS — R531 Weakness: Secondary | ICD-10-CM | POA: Diagnosis not present

## 2020-04-18 DIAGNOSIS — G311 Senile degeneration of brain, not elsewhere classified: Secondary | ICD-10-CM | POA: Diagnosis not present

## 2020-04-18 DIAGNOSIS — E119 Type 2 diabetes mellitus without complications: Secondary | ICD-10-CM | POA: Diagnosis not present

## 2020-04-18 DIAGNOSIS — G894 Chronic pain syndrome: Secondary | ICD-10-CM | POA: Diagnosis not present

## 2020-04-18 DIAGNOSIS — I10 Essential (primary) hypertension: Secondary | ICD-10-CM | POA: Diagnosis not present

## 2020-04-18 DIAGNOSIS — E46 Unspecified protein-calorie malnutrition: Secondary | ICD-10-CM | POA: Diagnosis not present

## 2020-04-19 DIAGNOSIS — E46 Unspecified protein-calorie malnutrition: Secondary | ICD-10-CM | POA: Diagnosis not present

## 2020-04-19 DIAGNOSIS — E119 Type 2 diabetes mellitus without complications: Secondary | ICD-10-CM | POA: Diagnosis not present

## 2020-04-19 DIAGNOSIS — G894 Chronic pain syndrome: Secondary | ICD-10-CM | POA: Diagnosis not present

## 2020-04-19 DIAGNOSIS — G311 Senile degeneration of brain, not elsewhere classified: Secondary | ICD-10-CM | POA: Diagnosis not present

## 2020-04-19 DIAGNOSIS — I10 Essential (primary) hypertension: Secondary | ICD-10-CM | POA: Diagnosis not present

## 2020-04-19 DIAGNOSIS — R531 Weakness: Secondary | ICD-10-CM | POA: Diagnosis not present

## 2020-04-20 DIAGNOSIS — R531 Weakness: Secondary | ICD-10-CM | POA: Diagnosis not present

## 2020-04-20 DIAGNOSIS — G894 Chronic pain syndrome: Secondary | ICD-10-CM | POA: Diagnosis not present

## 2020-04-20 DIAGNOSIS — E46 Unspecified protein-calorie malnutrition: Secondary | ICD-10-CM | POA: Diagnosis not present

## 2020-04-20 DIAGNOSIS — G311 Senile degeneration of brain, not elsewhere classified: Secondary | ICD-10-CM | POA: Diagnosis not present

## 2020-04-20 DIAGNOSIS — I10 Essential (primary) hypertension: Secondary | ICD-10-CM | POA: Diagnosis not present

## 2020-04-20 DIAGNOSIS — E119 Type 2 diabetes mellitus without complications: Secondary | ICD-10-CM | POA: Diagnosis not present

## 2020-04-21 DIAGNOSIS — E46 Unspecified protein-calorie malnutrition: Secondary | ICD-10-CM | POA: Diagnosis not present

## 2020-04-21 DIAGNOSIS — G311 Senile degeneration of brain, not elsewhere classified: Secondary | ICD-10-CM | POA: Diagnosis not present

## 2020-04-21 DIAGNOSIS — R531 Weakness: Secondary | ICD-10-CM | POA: Diagnosis not present

## 2020-04-21 DIAGNOSIS — E119 Type 2 diabetes mellitus without complications: Secondary | ICD-10-CM | POA: Diagnosis not present

## 2020-04-21 DIAGNOSIS — I10 Essential (primary) hypertension: Secondary | ICD-10-CM | POA: Diagnosis not present

## 2020-04-21 DIAGNOSIS — G894 Chronic pain syndrome: Secondary | ICD-10-CM | POA: Diagnosis not present

## 2020-05-22 DEATH — deceased

## 2020-07-20 IMAGING — MR MR MRA HEAD W/O CM
9 of 13 series · 33 of 48 positions shown · non-contrast
Comparison: Head CT 08/07/2016

CLINICAL DATA: Right-sided weakness.  Aphasia.

EXAM:
MRI HEAD WITHOUT CONTRAST
MRA HEAD WITHOUT CONTRAST
TECHNIQUE: Multiplanar, multiecho pulse sequences of the brain and surrounding
structures were obtained without intravenous contrast. Angiographic
images of the head were obtained using MRA technique without
contrast.

[Series 3: DWI · axial · 3.0mm · 1.09mm/px · z∈[-86,+67]mm · 9 of 106 slices shown (1 of 4)]
[im 1/106]
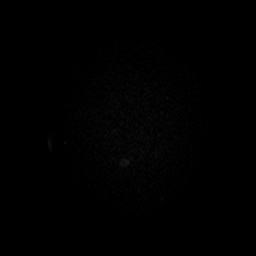
[im 14/106]
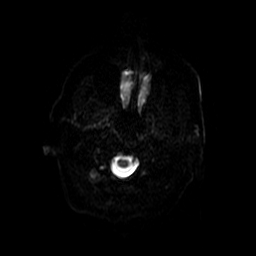
[im 27/106]
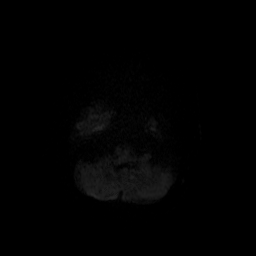
[im 40/106]
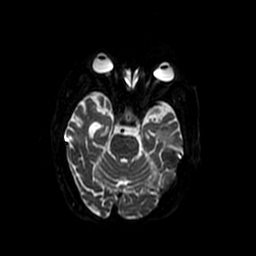
[im 53/106]
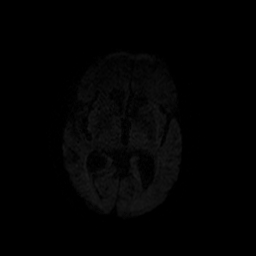
[im 66/106]
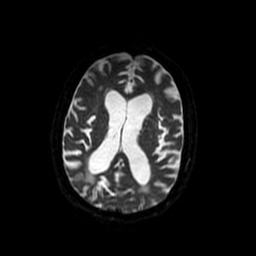
[im 79/106]
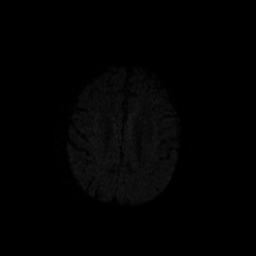
[im 92/106]
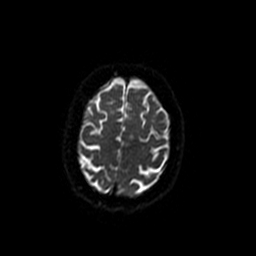
[im 106/106]
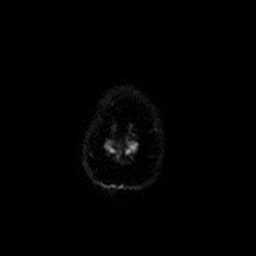

[Series 5: FLAIR · axial · 3.0mm · 0.47mm/px · z∈[-85,+68]mm · 2 of 27 slices shown (1 of 2)]
[im 1/27]
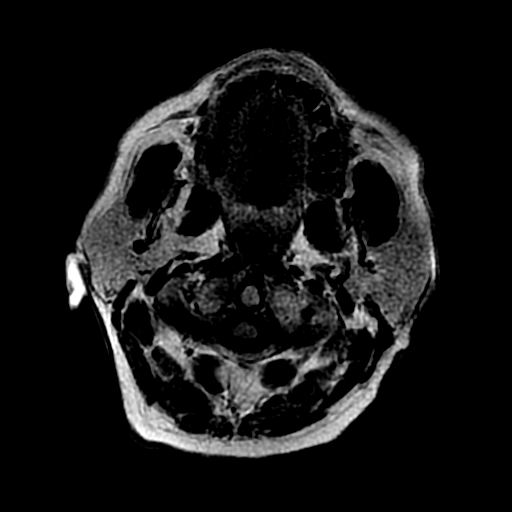
[im 27/27]
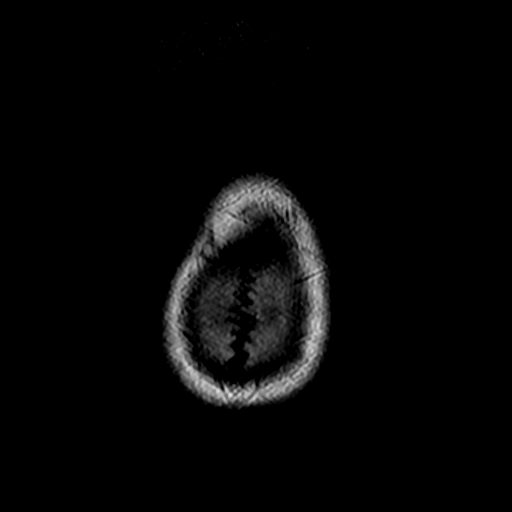

[Series 7: DWI · coronal · 5.0mm · 1.09mm/px · 6 of 74 slices shown (2 of 4)]
[im 1/74]
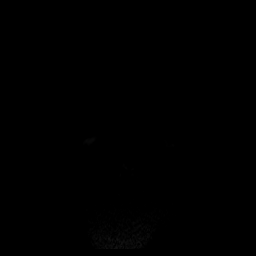
[im 15/74]
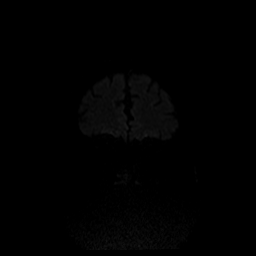
[im 30/74]
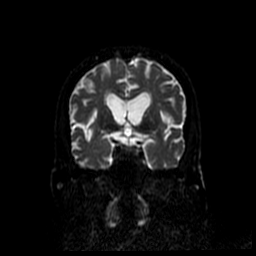
[im 44/74]
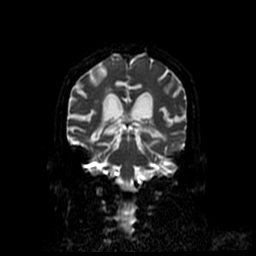
[im 59/74]
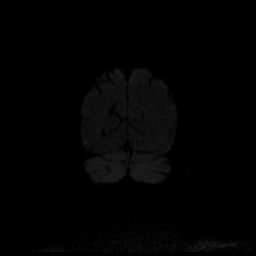
[im 74/74]
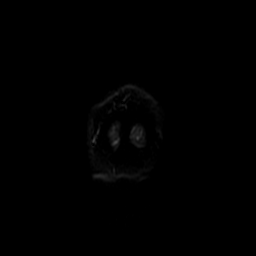

[Series 8: T2 · axial · 5.0mm · 0.47mm/px · z∈[-85,+68]mm · 2 of 27 slices shown (1 of 2)]
[im 1/27]
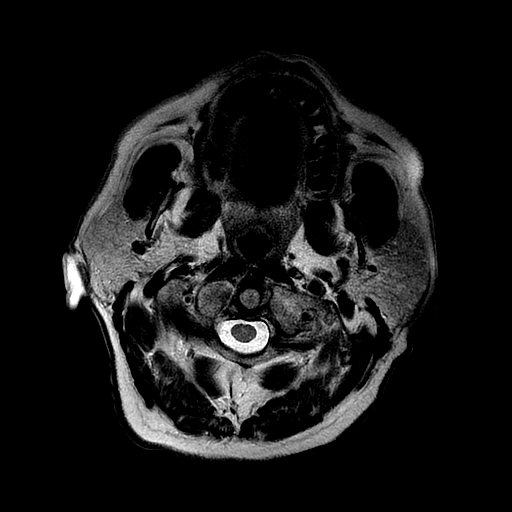
[im 27/27]
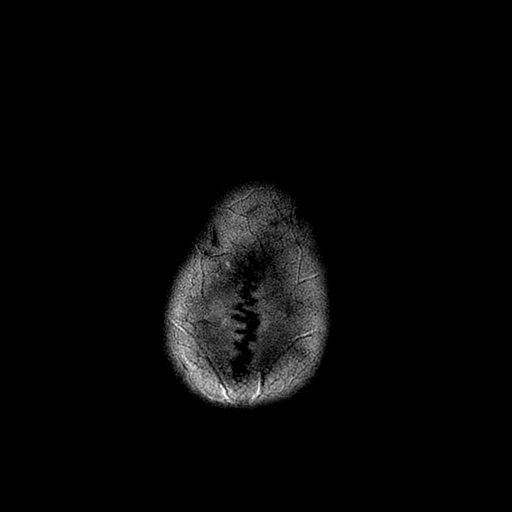

[Series 9: T1 · sagittal · 5.0mm · 0.47mm/px · 2 of 23 slices shown]
[im 1/23]
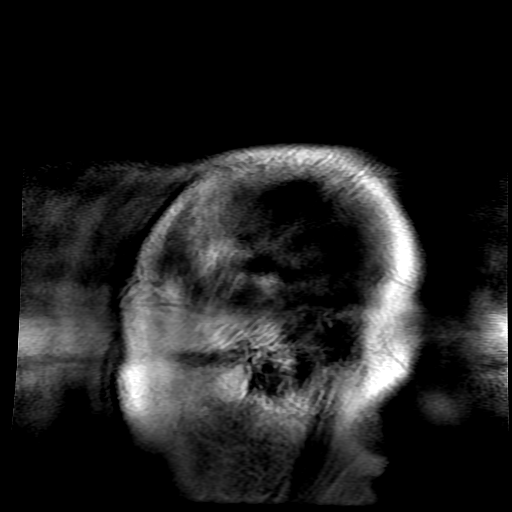
[im 23/23]
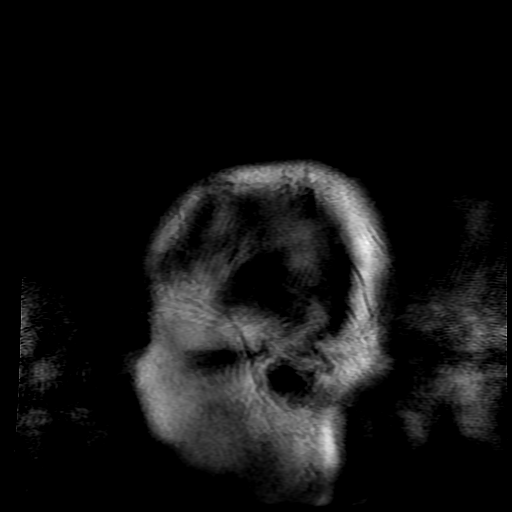

[Series 10: T2 · coronal · 5.0mm · 0.43mm/px · 3 of 31 slices shown (2 of 2)]
[im 1/31]
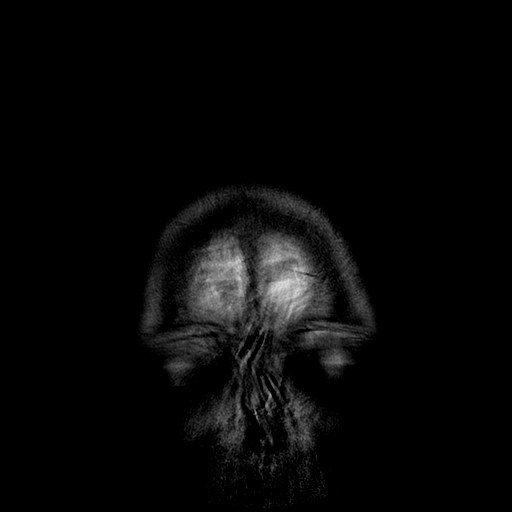
[im 16/31]
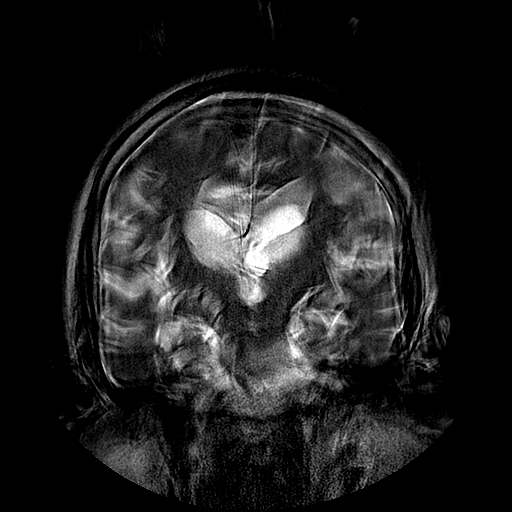
[im 31/31]
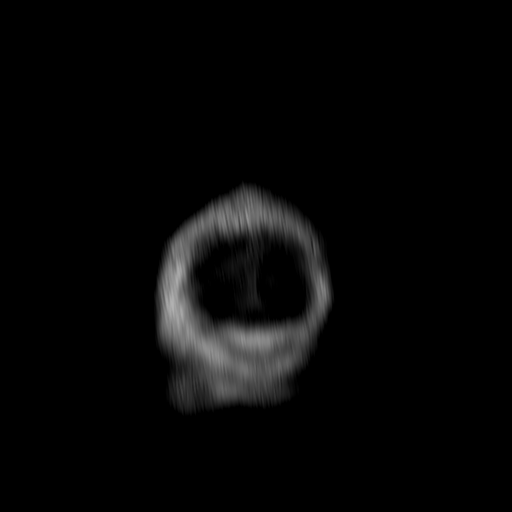

[Series 11: FLAIR · axial · 5.0mm · 0.47mm/px · z∈[-85,+68]mm · 2 of 27 slices shown (2 of 2)]
[im 1/27]
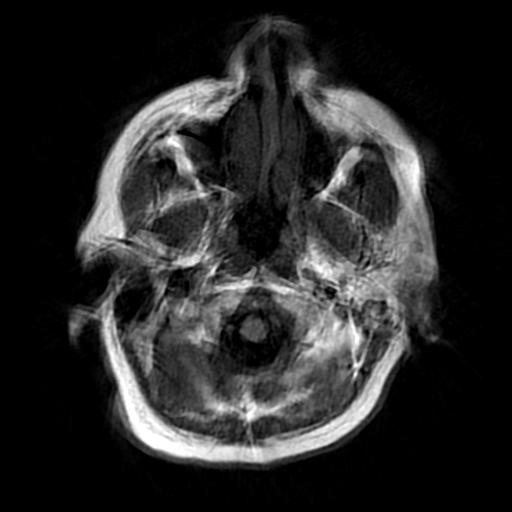
[im 27/27]
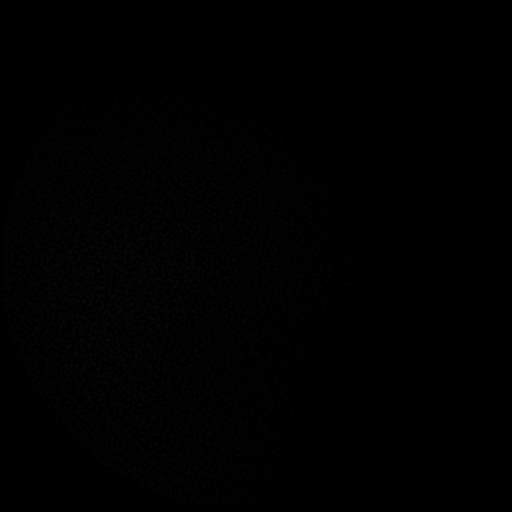

[Series 300: DWI · axial · 3.0mm · 1.09mm/px · z∈[-86,+67]mm · 4 of 53 slices shown (3 of 4)]
[im 1/53]
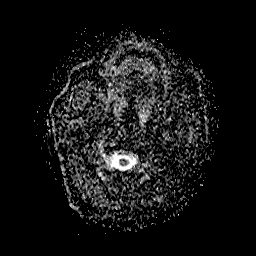
[im 18/53]
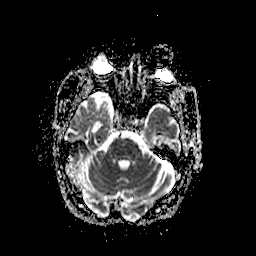
[im 35/53]
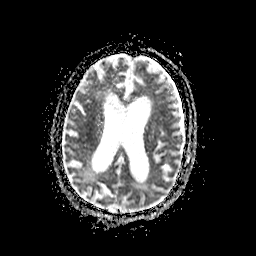
[im 53/53]
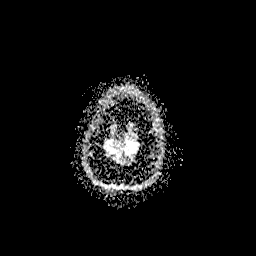

[Series 700: DWI · coronal · 5.0mm · 1.09mm/px · 3 of 37 slices shown (4 of 4)]
[im 1/37]
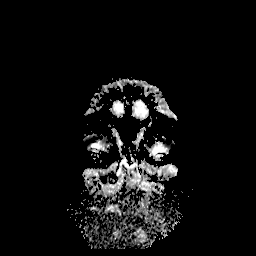
[im 19/37]
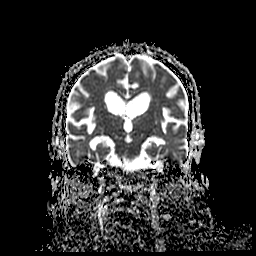
[im 37/37]
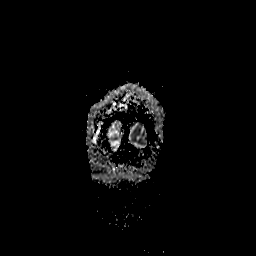

[33 of 48 positions shown; findings below may reference images not displayed]

FINDINGS: MRI HEAD FINDINGS

The study is motion degraded throughout with some sequences being
severely degraded.

Brain: No acute infarct is identified although motion artifact
reduces sensitivity for detection of very small infarcts. Scattered
chronic microhemorrhages are noted in both cerebral hemispheres. No
mass, midline shift, or extra-axial fluid collection is identified.
Small chronic infarcts are noted in the right occipital lobe and
right cerebellum. Patchy T2 hyperintensities in the cerebral white
matter bilaterally are nonspecific but compatible with
mild-to-moderate chronic small vessel ischemic disease. There is
moderate cerebral atrophy.

Vascular: Major intracranial vascular flow voids are preserved.

Skull and upper cervical spine: No destructive skull lesion.

Sinuses/Orbits: Unremarkable orbits. Trace left mastoid fluid. Clear
paranasal sinuses.

Other: None.

MRA HEAD FINDINGS

The study is moderately motion degraded.

The visualized distal vertebral arteries are patent with the left
being strongly dominant and supplying the basilar artery. The right
vertebral artery ends in PICA. The basilar artery is widely patent.
The PCAs are patent with asymmetric attenuation of the right PCA and
evidence of a severe proximal to mid right P2 stenosis. There may be
moderate to severe distal P1 stenoses bilaterally versus artifact.

The internal carotid arteries are patent from skull base to carotid
termini without evidence of significant stenosis within limitations
of motion. ACAs and MCAs are patent proximally with the left A1
segment appearing hypoplastic. The right A1 segment is widely
patent. Both M1 segments also appear widely patent although there is
artifactual signal loss at both MCA bifurcations. ACA and MCA branch
vessels are not well evaluated due to motion. No aneurysm is
identified.
IMPRESSION: 1. Motion degraded examination without evidence of acute
intracranial abnormality.
2. Mild-to-moderate chronic small vessel ischemic disease.
3. Small chronic right occipital and cerebellar infarcts.
4. Motion degraded head MRA without evidence of large vessel
occlusion.
5. Severe right P2 stenosis. Possible moderate to severe bilateral
P1 stenoses.

## 2020-07-20 IMAGING — DX DG CHEST 2V
2 series · 2 of 2 positions shown · non-contrast
Comparison: Radiograph June 10, 2014.

CLINICAL DATA: Chest pain, shortness of breath.

EXAM:
CHEST - 2 VIEW

[chest lat]
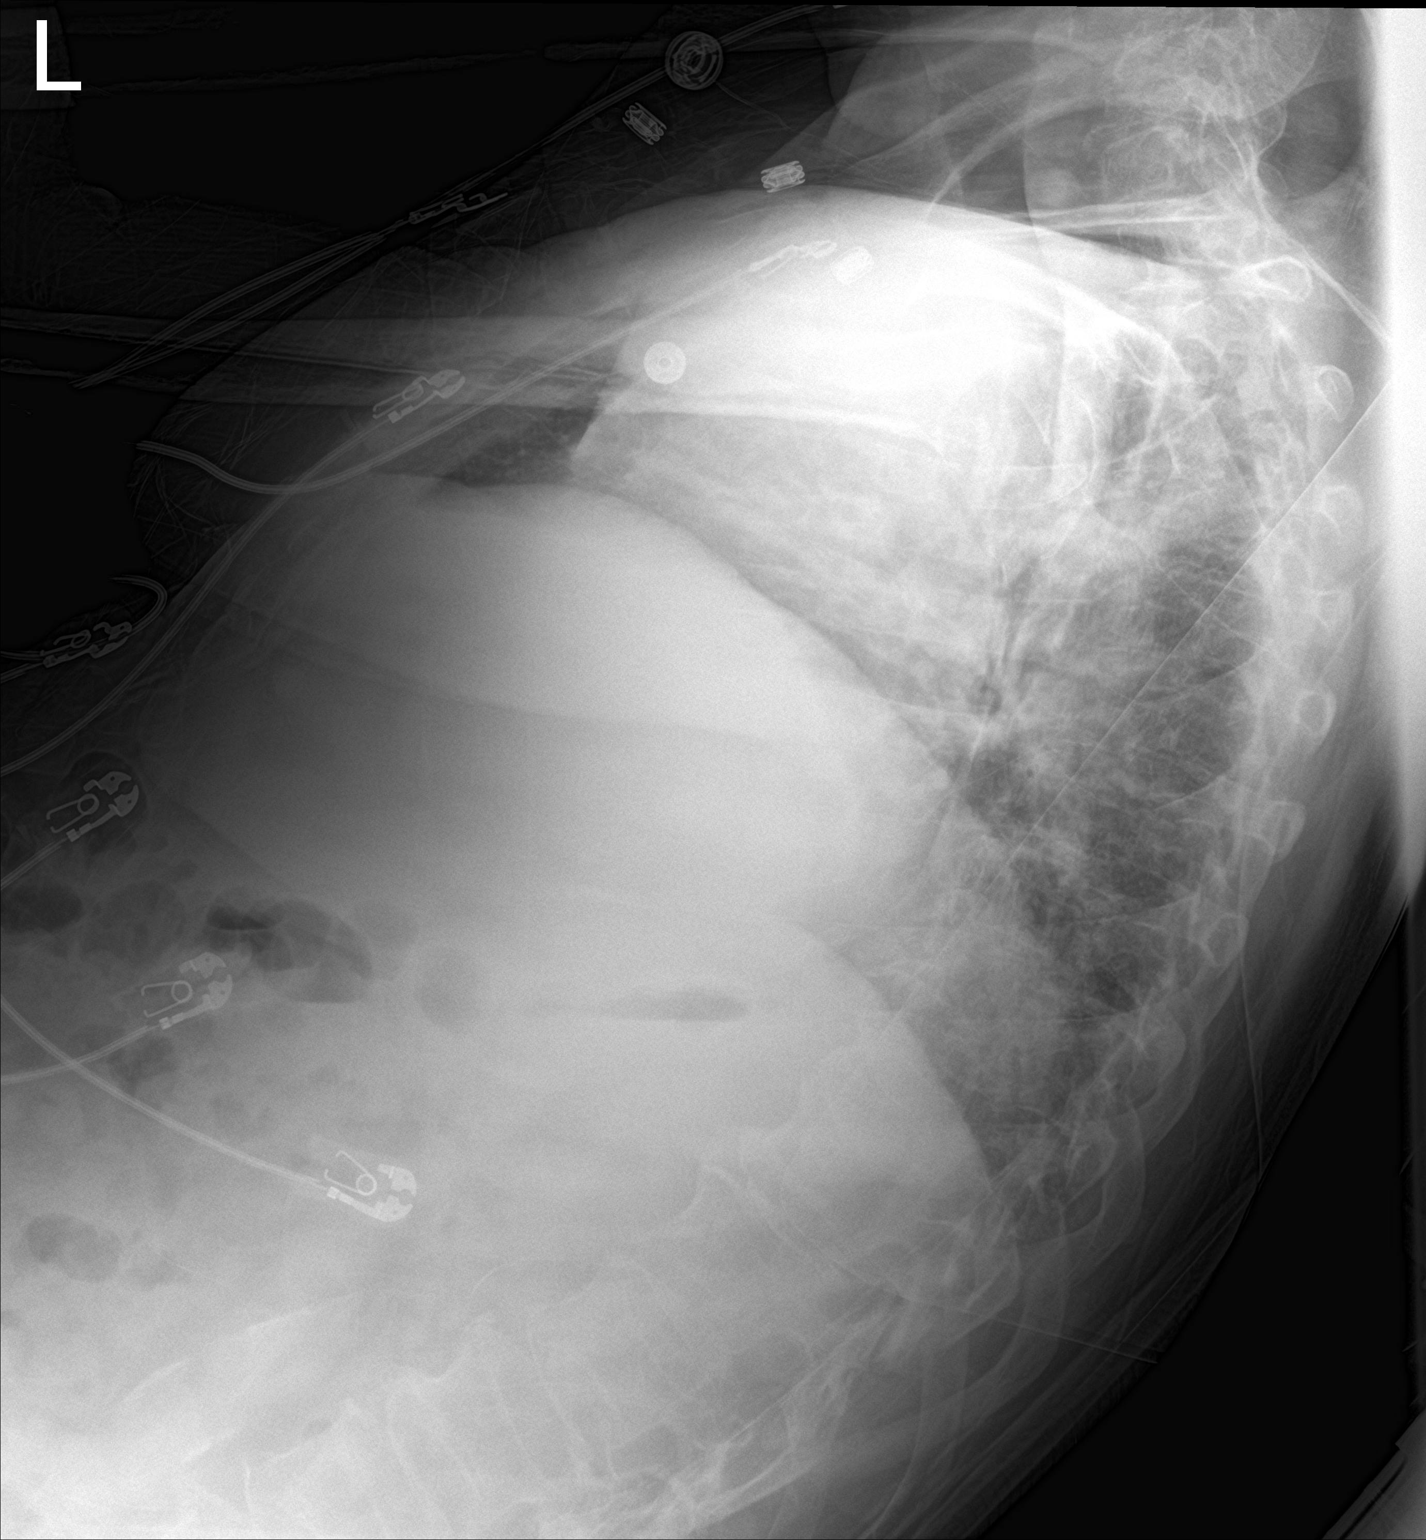

[chest ap]
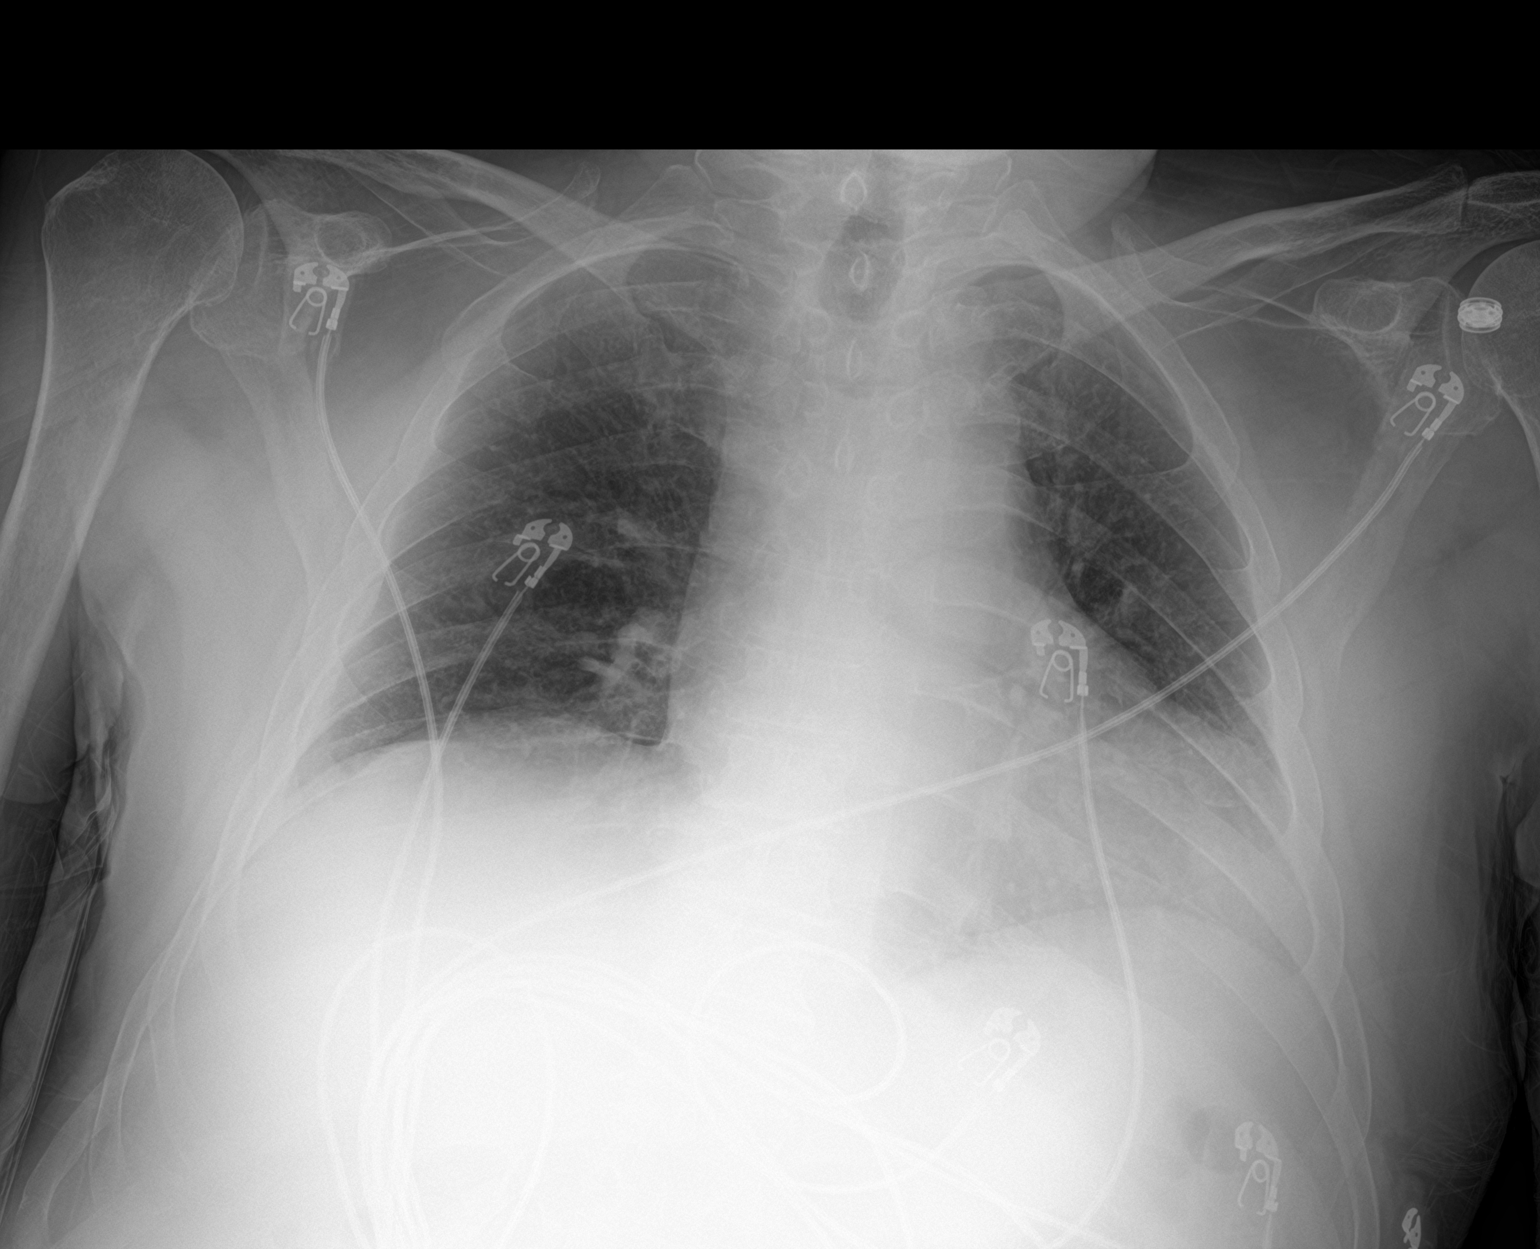

[2 of 2 positions shown; findings below may reference images not displayed]

FINDINGS: Stable cardiomegaly. No pneumothorax or pleural effusion is noted.
Both lungs are clear. The visualized skeletal structures are
unremarkable.
IMPRESSION: No active cardiopulmonary disease.
